# Patient Record
Sex: Male | Born: 1940 | Race: White | Hispanic: No | Marital: Single | Smoking: Never smoker
Health system: Southern US, Community
[De-identification: ages and names within clinical notes are randomized; demographics above are authoritative.]

## PROBLEM LIST (undated history)

## (undated) DIAGNOSIS — F79 Unspecified intellectual disabilities: Secondary | ICD-10-CM

## (undated) DIAGNOSIS — K219 Gastro-esophageal reflux disease without esophagitis: Secondary | ICD-10-CM

## (undated) DIAGNOSIS — I10 Essential (primary) hypertension: Secondary | ICD-10-CM

## (undated) DIAGNOSIS — D649 Anemia, unspecified: Secondary | ICD-10-CM

## (undated) DIAGNOSIS — R269 Unspecified abnormalities of gait and mobility: Secondary | ICD-10-CM

## (undated) DIAGNOSIS — R413 Other amnesia: Secondary | ICD-10-CM

## (undated) DIAGNOSIS — G253 Myoclonus: Secondary | ICD-10-CM

## (undated) DIAGNOSIS — N289 Disorder of kidney and ureter, unspecified: Secondary | ICD-10-CM

## (undated) DIAGNOSIS — N400 Enlarged prostate without lower urinary tract symptoms: Secondary | ICD-10-CM

## (undated) HISTORY — DX: Unspecified abnormalities of gait and mobility: R26.9

## (undated) HISTORY — DX: Other amnesia: R41.3

## (undated) HISTORY — DX: Myoclonus: G25.3

---

## 2001-02-17 ENCOUNTER — Emergency Department (HOSPITAL_COMMUNITY): Admission: EM | Admit: 2001-02-17 | Discharge: 2001-02-18 | Payer: Self-pay | Admitting: Emergency Medicine

## 2001-02-17 ENCOUNTER — Encounter: Payer: Self-pay | Admitting: Emergency Medicine

## 2001-04-09 ENCOUNTER — Ambulatory Visit (HOSPITAL_COMMUNITY): Admission: RE | Admit: 2001-04-09 | Discharge: 2001-04-09 | Payer: Self-pay | Admitting: *Deleted

## 2001-06-25 ENCOUNTER — Ambulatory Visit (HOSPITAL_BASED_OUTPATIENT_CLINIC_OR_DEPARTMENT_OTHER): Admission: RE | Admit: 2001-06-25 | Discharge: 2001-06-25 | Payer: Self-pay | Admitting: Orthopedic Surgery

## 2009-04-20 ENCOUNTER — Emergency Department (HOSPITAL_BASED_OUTPATIENT_CLINIC_OR_DEPARTMENT_OTHER): Admission: EM | Admit: 2009-04-20 | Discharge: 2009-04-20 | Payer: Self-pay | Admitting: Emergency Medicine

## 2009-04-21 ENCOUNTER — Encounter: Payer: Self-pay | Admitting: Emergency Medicine

## 2009-04-21 ENCOUNTER — Ambulatory Visit: Payer: Self-pay | Admitting: Cardiovascular Disease

## 2009-04-21 ENCOUNTER — Ambulatory Visit: Payer: Self-pay | Admitting: Diagnostic Radiology

## 2009-04-21 ENCOUNTER — Inpatient Hospital Stay (HOSPITAL_COMMUNITY): Admission: AD | Admit: 2009-04-21 | Discharge: 2009-04-22 | Payer: Self-pay | Admitting: Internal Medicine

## 2009-04-22 ENCOUNTER — Encounter (INDEPENDENT_AMBULATORY_CARE_PROVIDER_SITE_OTHER): Payer: Self-pay | Admitting: *Deleted

## 2010-10-16 ENCOUNTER — Emergency Department (HOSPITAL_BASED_OUTPATIENT_CLINIC_OR_DEPARTMENT_OTHER)
Admission: EM | Admit: 2010-10-16 | Discharge: 2010-10-16 | Disposition: A | Discharge status: Home / Self Care | Payer: Medicare Other | Source: Home / Self Care | Attending: Emergency Medicine | Admitting: Emergency Medicine

## 2010-10-16 DIAGNOSIS — Z79899 Other long term (current) drug therapy: Secondary | ICD-10-CM | POA: Insufficient documentation

## 2010-10-16 DIAGNOSIS — N390 Urinary tract infection, site not specified: Secondary | ICD-10-CM | POA: Insufficient documentation

## 2010-10-16 DIAGNOSIS — R748 Abnormal levels of other serum enzymes: Secondary | ICD-10-CM | POA: Insufficient documentation

## 2010-10-16 DIAGNOSIS — I10 Essential (primary) hypertension: Secondary | ICD-10-CM | POA: Insufficient documentation

## 2010-10-16 DIAGNOSIS — F79 Unspecified intellectual disabilities: Secondary | ICD-10-CM | POA: Insufficient documentation

## 2010-10-16 DIAGNOSIS — R1011 Right upper quadrant pain: Secondary | ICD-10-CM | POA: Insufficient documentation

## 2010-10-16 LAB — URINALYSIS, ROUTINE W REFLEX MICROSCOPIC
Ketones, ur: NEGATIVE mg/dL
Leukocytes, UA: NEGATIVE
Nitrite: NEGATIVE
Protein, ur: 30 mg/dL — AB
Specific Gravity, Urine: 1.027 (ref 1.005–1.030)
Urine Glucose, Fasting: NEGATIVE mg/dL
Urobilinogen, UA: 0.2 mg/dL (ref 0.0–1.0)
pH: 5.5 (ref 5.0–8.0)

## 2010-10-16 LAB — CBC
HCT: 43.3 % (ref 39.0–52.0)
Hemoglobin: 14.3 g/dL (ref 13.0–17.0)
MCH: 26.8 pg (ref 26.0–34.0)
MCHC: 33 g/dL (ref 30.0–36.0)
MCV: 81.1 fL (ref 78.0–100.0)
Platelets: 216 10*3/uL (ref 150–400)
RBC: 5.34 MIL/uL (ref 4.22–5.81)
RDW: 13.5 % (ref 11.5–15.5)
WBC: 5.4 10*3/uL (ref 4.0–10.5)

## 2010-10-16 LAB — DIFFERENTIAL
Basophils Absolute: 0 10*3/uL (ref 0.0–0.1)
Basophils Relative: 0 % (ref 0–1)
Eosinophils Absolute: 0 10*3/uL (ref 0.0–0.7)
Eosinophils Relative: 0 % (ref 0–5)
Lymphocytes Relative: 14 % (ref 12–46)
Lymphs Abs: 0.7 10*3/uL (ref 0.7–4.0)
Monocytes Absolute: 0.8 10*3/uL (ref 0.1–1.0)
Monocytes Relative: 15 % — ABNORMAL HIGH (ref 3–12)
Neutro Abs: 3.9 10*3/uL (ref 1.7–7.7)
Neutrophils Relative %: 72 % (ref 43–77)

## 2010-10-16 LAB — COMPREHENSIVE METABOLIC PANEL
AST: 90 U/L — ABNORMAL HIGH (ref 0–37)
Alkaline Phosphatase: 80 U/L (ref 39–117)
BUN: 19 mg/dL (ref 6–23)
CO2: 28 mEq/L (ref 19–32)
Chloride: 102 mEq/L (ref 96–112)
Creatinine, Ser: 1.4 mg/dL (ref 0.4–1.5)
GFR calc non Af Amer: 50 mL/min — ABNORMAL LOW (ref >60)
Potassium: 4.5 mEq/L (ref 3.5–5.1)
Total Bilirubin: 0.8 mg/dL (ref 0.3–1.2)

## 2010-10-16 LAB — URINE MICROSCOPIC-ADD ON

## 2010-10-17 ENCOUNTER — Ambulatory Visit (HOSPITAL_BASED_OUTPATIENT_CLINIC_OR_DEPARTMENT_OTHER)
Admission: RE | Admit: 2010-10-17 | Discharge: 2010-10-17 | Disposition: A | Discharge status: Home / Self Care | Payer: Medicare Other | Source: Ambulatory Visit | Attending: Emergency Medicine | Admitting: Emergency Medicine

## 2010-10-17 DIAGNOSIS — I10 Essential (primary) hypertension: Secondary | ICD-10-CM | POA: Insufficient documentation

## 2010-10-17 DIAGNOSIS — R112 Nausea with vomiting, unspecified: Secondary | ICD-10-CM | POA: Insufficient documentation

## 2010-10-17 DIAGNOSIS — Z79899 Other long term (current) drug therapy: Secondary | ICD-10-CM | POA: Insufficient documentation

## 2010-10-17 DIAGNOSIS — R748 Abnormal levels of other serum enzymes: Secondary | ICD-10-CM | POA: Insufficient documentation

## 2010-10-17 DIAGNOSIS — N390 Urinary tract infection, site not specified: Secondary | ICD-10-CM | POA: Insufficient documentation

## 2010-10-17 DIAGNOSIS — R109 Unspecified abdominal pain: Secondary | ICD-10-CM

## 2010-10-17 DIAGNOSIS — R1011 Right upper quadrant pain: Secondary | ICD-10-CM | POA: Insufficient documentation

## 2010-10-17 DIAGNOSIS — F79 Unspecified intellectual disabilities: Secondary | ICD-10-CM | POA: Insufficient documentation

## 2010-12-04 LAB — CBC
HCT: 41.8 % (ref 39.0–52.0)
HCT: 42.4 % (ref 39.0–52.0)
MCHC: 33.5 g/dL (ref 30.0–36.0)
MCHC: 33.7 g/dL (ref 30.0–36.0)
MCV: 84.4 fL (ref 78.0–100.0)
MCV: 85.3 fL (ref 78.0–100.0)
Platelets: 216 10*3/uL (ref 150–400)
RBC: 4.96 MIL/uL (ref 4.22–5.81)
RDW: 12.5 % (ref 11.5–15.5)
WBC: 6.2 10*3/uL (ref 4.0–10.5)
WBC: 7 10*3/uL (ref 4.0–10.5)

## 2010-12-04 LAB — CARDIAC PANEL(CRET KIN+CKTOT+MB+TROPI)
CK, MB: 1.7 ng/mL (ref 0.3–4.0)
CK, MB: 1.8 ng/mL (ref 0.3–4.0)
Relative Index: INVALID (ref 0.0–2.5)
Relative Index: INVALID (ref 0.0–2.5)
Troponin I: 0.01 ng/mL (ref 0.00–0.06)
Troponin I: 0.01 ng/mL (ref 0.00–0.06)

## 2010-12-04 LAB — COMPREHENSIVE METABOLIC PANEL
ALT: 25 U/L (ref 0–53)
AST: 23 U/L (ref 0–37)
Albumin: 3.9 g/dL (ref 3.5–5.2)
CO2: 29 mEq/L (ref 19–32)
Calcium: 8.9 mg/dL (ref 8.4–10.5)
Creatinine, Ser: 1.42 mg/dL (ref 0.4–1.5)
GFR calc Af Amer: >60 mL/min (ref >60)
GFR calc non Af Amer: 50 mL/min — ABNORMAL LOW (ref >60)
Sodium: 138 mEq/L (ref 135–145)

## 2010-12-04 LAB — URINALYSIS, ROUTINE W REFLEX MICROSCOPIC
Glucose, UA: NEGATIVE mg/dL
Hgb urine dipstick: NEGATIVE
Ketones, ur: NEGATIVE mg/dL
pH: 7 (ref 5.0–8.0)

## 2010-12-04 LAB — LIPID PANEL
HDL: 43 mg/dL (ref >39)
Total CHOL/HDL Ratio: 3.4 RATIO
Triglycerides: 50 mg/dL (ref <150)
VLDL: 10 mg/dL (ref 0–40)

## 2010-12-04 LAB — DIFFERENTIAL
Basophils Relative: 0 % (ref 0–1)
Eosinophils Absolute: 0.1 10*3/uL (ref 0.0–0.7)
Eosinophils Absolute: 0.1 10*3/uL (ref 0.0–0.7)
Eosinophils Relative: 1 % (ref 0–5)
Eosinophils Relative: 1 % (ref 0–5)
Lymphs Abs: 1.5 10*3/uL (ref 0.7–4.0)
Lymphs Abs: 2.1 10*3/uL (ref 0.7–4.0)
Monocytes Absolute: 0.8 10*3/uL (ref 0.1–1.0)
Monocytes Relative: 13 % — ABNORMAL HIGH (ref 3–12)
Neutrophils Relative %: 57 % (ref 43–77)

## 2010-12-04 LAB — BASIC METABOLIC PANEL
BUN: 13 mg/dL (ref 6–23)
CO2: 28 mEq/L (ref 19–32)
CO2: 30 mEq/L (ref 19–32)
Chloride: 100 mEq/L (ref 96–112)
Chloride: 103 mEq/L (ref 96–112)
Creatinine, Ser: 1.3 mg/dL (ref 0.4–1.5)
GFR calc Af Amer: >60 mL/min (ref >60)
Glucose, Bld: 112 mg/dL — ABNORMAL HIGH (ref 70–99)
Potassium: 4.2 mEq/L (ref 3.5–5.1)
Potassium: 4.9 mEq/L (ref 3.5–5.1)

## 2010-12-04 LAB — TSH: TSH: 3.338 u[IU]/mL (ref 0.350–4.500)

## 2010-12-04 LAB — POCT CARDIAC MARKERS: Myoglobin, poc: 85.5 ng/mL (ref 12–200)

## 2011-01-11 NOTE — H&P (Signed)
Steven Harper, Steven Harper                  ACCOUNT NO.:  [id number]   MEDICAL RECORD NO.:  [id number]          PATIENT TYPE:  INP   LOCATION:  4707                         FACILITY:  [facility]   PHYSICIAN:  Beckey Rutter, MD  DATE OF BIRTH:  07/23/1941   DATE OF ADMISSION:  04/21/2009  DATE OF DISCHARGE:                              HISTORY & PHYSICAL   ADDENDUM:   SOCIAL HISTORY:  The patient lives in a group home.   REVIEW OF SYSTEMS:  A 14-point review of systems is noncontributory.   PHYSICAL EXAMINATION:  VITAL SIGNS:  His temperature 97.8, heart rate  66, respiratory rate is 18, blood pressure is 134/81, oxygen saturation  is 97 on room air.  HEENT:  Head; atraumatic, normocephalic.  Eyes, PERRL.  Mouth, moist.  No ulcer.  NECK:  Supple.  No JVD.  CARDIAC:  Precordium first and second heart sounds audible.  No added  sounds.  LUNGS:  Clear.  No adventitious sounds.  ABDOMEN:  Soft, nontender.  Bowel sounds present.  EXTREMITIES:  No lower extremities edema.  NEUROLOGIC:  Alert, oriented x3, giving history.  Moving all his  extremities spontaneously.  No focal symptoms.   His EKGs, normal sinus rhythm at ventricular rate of 65 beats per  minute.  His cardiac enzymes showing troponin less than 0.05, myoglobin  is 106, CK-MB is less than 1.0.  Sodium is 142, potassium 4.9, chloride  103, bicarb 30, glucose 103, BUN is 13, creatinine is 1.3.  GFR is 55%.  White blood count is 6.2, hemoglobin is 14.1, hematocrit is 41.8,  platelet count is 12.3.  Urinalysis is negative.  Please note, some of  those tests are done yesterday.   ASSESSMENT AND PLAN:  1. This is a 70 year old pleasant Caucasian male presented with right-      sided chest pain, atypical for cardiogenic origin.  Nevertheless,      the patient will be admitted for 23-hour observation on the      telemetry.  We will cycle cardiac enzymes.  We will continue with      EKG every 8 hours.  If the patient ruled out, he would  need a      stress test as an outpatient.  2. For his hypertension, we will continue on lisinopril.  3. For his acid reflux, the patient will be started on Protonix during      this hospital stay.  4. Deep venous thrombosis prophylaxis.  We will start heparin      subcutaneously every 8 hours.   Time spent more than 55 minutes.      Beckey Rutter, MD  Electronically Signed    EME/MEDQ  D:  04/21/2009  T:  04/22/2009  Job:  [id number]

## 2011-01-11 NOTE — Discharge Summary (Signed)
NAMEMAHLON, GABRIELLE NO.:  [id number]   MEDICAL RECORD NO.:  [id number]          PATIENT TYPE:  INP   LOCATION:  4707                         FACILITY:  [facility]   PHYSICIAN:  Charlestine Massed, MDDATE OF BIRTH:  November 17, 1940   DATE OF ADMISSION:  04/21/2009  DATE OF DISCHARGE:  04/22/2009                               DISCHARGE SUMMARY   PRIMARY CARE PHYSICIAN:  Rockney Ghee, PA-C at [facility] at [place], [address] [place]  [address], phone [phone], fax [phone].   REASON FOR ADMISSION:  Chest pain at multiple points on the right and  left side of the chest.   DISCHARGE DIAGNOSIS:  1. Chest pain most likely noncardiac in origin.  2. Mental retardation.  3. History of chronic kidney disease, currently stable with no      worsening.  4. Hypertension.  5. Dyslipidemia.  6. Benign prostatic hyperplasia.  7. GERD   DISCHARGE MEDICATIONS:  1. ABC plus Senior tablets - Centrum 1 tablet daily.  2. Ferrex 150 units once daily.  3. Fiber Choice two tablets once daily.  4. Lisinopril 2.5 mg p.o. daily.  5. Omeprazole 20 mg p.o. daily.  6. Zocor 20 mg p.o. daily.  7. Aspirin 81 mg p.o. daily.   HOSPITAL COURSE:  1. Chest pain.  The patient was admitted on April 21, 2009 for      complaints of pain on the chest.  The patient's pain was occurring      on multiple points on the chest pain and at all points.  The      patient was able to point in the side of pain with one finger which      goes against the diagnosis of angina.  Pain was sharp in nature,      nonradiating and was lasting for a few seconds to few minutes and      there is no increase with respiration, no worsening with exertion      and no relieving factors.  The patient was admitted to telemetry      floor, observed on telemetry and telemetry has revealed no evidence      of any arrhythmias.  The patient had a CK troponin x3 and EKGs done    which did not show any dynamic changes and troponins are negative.      The patient had an echocardiogram done which revealed normal      ejection fraction and no evidence of any wall motion abnormalities      at this time.  The patient currently with the history as well as      with evaluation, the chest pain looks more noncardiac in origin.  I      spoke with Suszanne Conners, his primary MD at [facility] and he      has agreed to set him up for outpatient stress test.  The patient      will be seeing his primary doctor on Friday, 8:10 a.m. and will      follow up there for  further testing.  He will continue aspirin at      81 mg p.o. daily until then.  2. Hypertension.  Continue lisinopril.  Currently blood pressure is      within normal limits.  3. History of gastroesophageal reflux.  Continue omeprazole 20 mg p.o.      daily.  4. Dyslipidemia.  Continue Zocor.  5. Vitamin supplements.  Continue cyanocobalamin once a month and ABC      plus senior tablet and Ferrex and Fiber Choice chew 2 tablets by      mouth every day.   TESTS DONE DURING THIS ADMISSION:  1. ECHOCARDIOGRAM:  2. Left ventricle: The cavity size was normal. Wall thickness was    normal. Systolic function was normal. The estimated ejection    fraction was in the range of 55% to 60%.  1. Left atrium: The atrium was mildly dilated.  2. Atrial septum: No defect or patent foramen ovale was identified.   DISPOSITION:  Discharged back to the assisted living facility.   FOLLOW UP:  Follow up with Suszanne Conners, PA-C at 10:00 a.m. on April 24, 2009.   INSTRUCTIONS:  1. Fall precautions.  2. Adherence with medications.  3. Adherence with appointments.  4. Stress test as out patient - to be set up by PMD. I discussed with      his PMD - Suszanne Conners personally about this.    A total of 45 minutes spent on the discharge today.      Charlestine Massed, MD  Electronically Signed     UT/MEDQ  D:  04/22/2009   T:  04/22/2009  Job:  [id number]   cc:   Rockney Ghee, PA-C

## 2011-01-11 NOTE — H&P (Signed)
NAMEPARAG, DORTON                  ACCOUNT NO.:  [id number]   MEDICAL RECORD NO.:  [id number]          PATIENT TYPE:  INP   LOCATION:  4707                         FACILITY:  [facility]   PHYSICIAN:  Beckey Rutter, MD  DATE OF BIRTH:  12/15/1940   DATE OF ADMISSION:  04/21/2009  DATE OF DISCHARGE:                              HISTORY & PHYSICAL   PRIMARY CARE PHYSICIAN:  In [facility], this patient is  unassigned to [facility].   CHIEF COMPLAINT:  Chest pain.   HISTORY OF PRESENT ILLNESS:  [name] is a 70 year old Caucasian  gentleman with a past medical history significant for benign prostatic  hyperplasia, anemia, and mental retardation.  The patient lives in a  group home in the [facility].  This morning he started to complain  about right-sided chest pain before the breakfast session.  The patient  then was transferred to the emergency department.  By arrival to the  emergency department, the patient's condition improved, i.e. no more  chest pain.  He denied any association with diaphoresis, shortness of  breath, or cough.  He denied headache, nausea, or vomiting.   It is worth mentioning that [name] had felt dizzy 2 days ago and he  mentioned that he fell, but he did not specifically mention trauma to  his chest.  The patient was tested and evaluated at that time with  negative results.   PAST MEDICAL HISTORY:  1. Benign prostatic hyperplasia.  2. Hypertension.  3. Mental retardation.  4. Renal insufficiency.   MEDICATION ALLERGIES:  Not known to have medication allergy.   MEDICATIONS:  1. Cyanocobalamin 1000 mcg daily.  2. Lisinopril 2.5 mg once a day.  3. Omeprazole 20 mg once daily.  4. Zocor 20 mg daily.   Dictation ended at this point.      Beckey Rutter, MD  Electronically Signed     EME/MEDQ  D:  04/21/2009  T:  04/22/2009  Job:  [id number]

## 2011-01-14 NOTE — Op Note (Signed)
[place]  Patient:    ARYAMAN, HALIBURTON Visit Number: [id number] MRN: [id number]          Service Type: DSU Location: [facility] Attending Physician:  Twana First Dictated by:   Elana Alm Thurston Hole, M.D. Proc. Date: 06/25/01 Admit Date:  06/25/2001                             Operative Report  PREOPERATIVE DIAGNOSIS:  Left knee medial meniscus tear.  POSTOPERATIVE DIAGNOSIS: 1. Left knee medial and lateral meniscal tears. 2. 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  PROCEDURE: 1. Left knee EUA followed by arthroscopic partial medial and lateral    meniscectomies. 2. Left knee chondroplasty.  SURGEON:  Elana Alm. Thurston Hole, M.D.  ANESTHESIA:  General.  OPERATIVE TIME:  30 minutes.  COMPLICATIONS:  None.  INDICATIONS FOR PROCEDURE:  Mr. Swiss is a 70 year old gentleman who has had significant left knee pain for the past six months, increasing in nature, with signs and symptoms and MRI documenting a medial meniscus tear who has failed conservative care and is now to undergo arthroscopy.  DESCRIPTION:  Mr. Ghosh was brought to the operating room June 25, 2001, placed on the operative table in supine position.  After an adequate level of general anesthesia was obtained, his left knee was examined under anesthesia. Range of motion was from 0-130 degrees, 1+ crepitation, knee stable to ligamentous exam with normal patella tracking.  The knee was sterilely injected with 0.25% Marcaine with epinephrine.  The left leg was prepped using sterile Betadine and draped using sterile technique.  Originally, through an inferolateral portal, the arthroscope with a pump attached was placed, and through an inferior medial portal and arthroscopic probe was placed.  On initial inspection of the medial compartment, the articular cartilage, medial femoral condyle, medial tibial plateau showed 25-30% grade 3 chondromalacia, the  grafts grade 1 and 2 changes.  The medial meniscus showed tearing of the posterior and medial horn, of which 30-40% was resected back to a stable rim. Intercondylar notch inspected - anterior and posterior cruciate ligaments were normal.  Lateral compartment inspected - articular cartilage, lateral femoral condyle, lateral tibial plateau showed mild grade 1 and 2 chondromalacia. Lateral meniscus showed a small tear at the posterolateral corner, 25%, which was resected back to a stable rim.  Patellofemoral joint showed grade 4 changes over 50% of the patella, the rest grade 3 changes and grade 2 and 3 changes in the femoral groove and this was debrided.  The patella tracked normally.  Moderate synovitis in the medial and lateral gutters were debrided; otherwise, they were free of pathology.  After this was done it was felt that all pathology had been satisfactorily addressed.  The instruments were removed.  Portals closed with 3-0 nylon suture and injected with 0.25% Marcaine with epinephrine and morphine 4 mg.  Sterile dressings applied and the patient awakened and taken to recovery room in stable condition.  FOLLOW-UP CARE:  Mr. Lubrano will be followed as an outpatient on Vicodin and Vioxx.  See him back in the office in a week for sutures out and follow-up.Dictated by:   Elana Alm Thurston Hole, M.D. Attending Physician:  Twana First DD:  06/25/01 TD:  06/25/01 Job: [id number]

## 2011-01-14 NOTE — Procedures (Signed)
[facility]  Patient:    Steven Harper, Steven Harper                           MRN: [id number] Proc. Date: 04/09/01 Adm. Date:  [id number] Attending:  Sabino Gasser                           Procedure Report  PROCEDURE:  Colonoscopy.  INDICATIONS FOR PROCEDURE:  Colon cancer screening.  ANESTHESIA:  Demerol 40, Versed 4 mg.  DESCRIPTION OF PROCEDURE:  With the patient mildly sedated in the left lateral decubitus position, the Olympus videoscopic variable stiffness colonoscope was inserted in the rectum and passed under direct vision to the cecum identified by the ileocecal valve and appendiceal orifice both of which were photographed. From this point, the colonoscope was slowly withdrawn taking circumferential views of the entire colonic mucosa stopping in the rectum which appeared normal in direct and retroflexed view. The endoscope was straightened and withdrawn. The patients vital signs and pulse oximeter remained stable. The patient tolerated the procedure well without apparent complications.  FINDINGS:  Negative examination.  PLAN:  Repeat examination possibly in 5-10 years. DD:  04/09/01 TD:  04/09/01 Job: [id number]

## 2011-06-30 ENCOUNTER — Emergency Department (INDEPENDENT_AMBULATORY_CARE_PROVIDER_SITE_OTHER): Payer: Medicare Other

## 2011-06-30 ENCOUNTER — Emergency Department (HOSPITAL_BASED_OUTPATIENT_CLINIC_OR_DEPARTMENT_OTHER)
Admission: EM | Admit: 2011-06-30 | Discharge: 2011-06-30 | Disposition: A | Discharge status: Home / Self Care | Payer: Medicare Other | Attending: Emergency Medicine | Admitting: Emergency Medicine

## 2011-06-30 ENCOUNTER — Encounter: Payer: Self-pay | Admitting: *Deleted

## 2011-06-30 DIAGNOSIS — G319 Degenerative disease of nervous system, unspecified: Secondary | ICD-10-CM

## 2011-06-30 DIAGNOSIS — R51 Headache: Secondary | ICD-10-CM | POA: Insufficient documentation

## 2011-06-30 DIAGNOSIS — M47812 Spondylosis without myelopathy or radiculopathy, cervical region: Secondary | ICD-10-CM

## 2011-06-30 DIAGNOSIS — M549 Dorsalgia, unspecified: Secondary | ICD-10-CM | POA: Insufficient documentation

## 2011-06-30 DIAGNOSIS — Z79899 Other long term (current) drug therapy: Secondary | ICD-10-CM | POA: Insufficient documentation

## 2011-06-30 DIAGNOSIS — W19XXXA Unspecified fall, initial encounter: Secondary | ICD-10-CM

## 2011-06-30 DIAGNOSIS — IMO0002 Reserved for concepts with insufficient information to code with codable children: Secondary | ICD-10-CM

## 2011-06-30 DIAGNOSIS — I10 Essential (primary) hypertension: Secondary | ICD-10-CM | POA: Insufficient documentation

## 2011-06-30 DIAGNOSIS — F79 Unspecified intellectual disabilities: Secondary | ICD-10-CM | POA: Insufficient documentation

## 2011-06-30 DIAGNOSIS — M79609 Pain in unspecified limb: Secondary | ICD-10-CM

## 2011-06-30 DIAGNOSIS — M542 Cervicalgia: Secondary | ICD-10-CM | POA: Insufficient documentation

## 2011-06-30 DIAGNOSIS — W1809XA Striking against other object with subsequent fall, initial encounter: Secondary | ICD-10-CM | POA: Insufficient documentation

## 2011-06-30 DIAGNOSIS — I6789 Other cerebrovascular disease: Secondary | ICD-10-CM

## 2011-06-30 HISTORY — DX: Gastro-esophageal reflux disease without esophagitis: K21.9

## 2011-06-30 HISTORY — DX: Benign prostatic hyperplasia without lower urinary tract symptoms: N40.0

## 2011-06-30 HISTORY — DX: Unspecified intellectual disabilities: F79

## 2011-06-30 HISTORY — DX: Essential (primary) hypertension: I10

## 2011-06-30 HISTORY — DX: Anemia, unspecified: D64.9

## 2011-06-30 HISTORY — DX: Disorder of kidney and ureter, unspecified: N28.9

## 2011-06-30 NOTE — ED Notes (Signed)
Pt from [facility]. EMS sts pt was standing in a chair when he fell backwards striking his head on the floor. Pt c/o head pain, thoracic spine pain and leg pain. Pt in c-collar, head blocks and LSB. EMS sts no LOC.

## 2011-06-30 NOTE — ED Provider Notes (Signed)
History     CSN: [id number] Arrival date & time: 06/30/2011  8:39 PM   First MD Initiated Contact with Patient 06/30/11 2116      Chief Complaint  Patient presents with  . Fall    (Consider location/radiation/quality/duration/timing/severity/associated sxs/prior treatment) HPI Comments: Patient was putting a calendar on his wall, fell backwards, hitting the back of his head his lower back. He mass was called. They placed him on a backboard with cervical collar. After he was placed on a backboard, he noted pain in his right thigh. He was [place] [facility] [place].  Pt is unable to perform review of systems to to mental retardation.   Patient is a 70 y.o. male presenting with fall.  Fall    Past Medical History  Diagnosis Date  . Hypertension   . Renal disorder   . BPH (benign prostatic hyperplasia)   . Mental retardation   . Anemia   . GERD (gastroesophageal reflux disease)     History reviewed. No pertinent past surgical history.  History reviewed. No pertinent family history.  History  Substance Use Topics  . Smoking status: Never Smoker   . Smokeless tobacco: Not on file  . Alcohol Use: No      Review of Systems  Unable to perform ROS: Other    Allergies  Review of patient's allergies indicates no known allergies.  Home Medications   Current Outpatient Rx  Name Route Sig Dispense Refill  . ASPIRIN 81 MG PO CHEW Oral Chew 81 mg by mouth daily.      . COLESEVELAM HCL 3.75 G PO PACK Oral Take 1 each by mouth daily. Mixed in 4 oz of water     . FIBER PO CHEW Oral Chew 2 tablets by mouth daily.      Marland Kitchen HYDROCHLOROTHIAZIDE 12.5 MG PO TABS Oral Take 12.5 mg by mouth every other day.      . CERTA-VITE SENIOR-LUTEIN PO TABS Oral Take 1 tablet by mouth daily.      Marland Kitchen OMEPRAZOLE 20 MG PO CPDR Oral Take 20 mg by mouth daily.      Marland Kitchen POTASSIUM CHLORIDE CRYS CR 20 MEQ PO TBCR Oral Take 20 mEq by mouth daily.      . ACETAMINOPHEN 325 MG PO  TABS Oral Take 650 mg by mouth every 8 (eight) hours as needed. For pain or fever     . CYANOCOBALAMIN 1000 MCG/ML IJ SOLN Intramuscular Inject 1,000 mcg into the muscle once.      Marland Kitchen LOPERAMIDE HCL 2 MG PO CAPS Oral Take 1 mg by mouth every 4 (four) hours as needed. For diarrhea until cleared       BP 170/95  Pulse 73  Temp(Src) 98 F (36.7 C) (Oral)  Resp 18  SpO2 98%  Physical Exam  Nursing note and vitals reviewed. Constitutional: He appears well-developed and well-nourished.       He was immobilized on a backboard with cervical collar in place.  HENT:  Head: Normocephalic and atraumatic.  Right Ear: External ear normal.  Left Ear: External ear normal.  Mouth/Throat: Oropharynx is clear and moist.  Eyes: Conjunctivae and EOM are normal. Pupils are equal, round, and reactive to light.  Neck: Normal range of motion. Neck supple.       There is no midline tenderness and no bony deformity the neck. He has full range of motion of his neck without pain. His cervical collar and backboard were discontinued by  me.  Cardiovascular: Normal rate, regular rhythm and normal heart sounds.   Pulmonary/Chest: Effort normal and breath sounds normal.  Abdominal: Soft. Bowel sounds are normal.  Musculoskeletal: Normal range of motion.       He localizes some pain to his lumbar region and to his right thigh. There is no bony deformity or point tenderness ovaries these regions.  Neurological: He is alert. He has normal reflexes.       No sensory or motor deficits.  Skin: Skin is warm and dry.  Psychiatric: He has a normal mood and affect. His behavior is normal.    ED Course  Procedures (including critical care time)  11:40 PM Patient was seen and had physical examination. X-rays were ordered. 11:40 PM X-rays were negative.  Reassured and released.  1. Darlin Coco III, MD 06/30/11 [phone]

## 2011-06-30 NOTE — ED Notes (Signed)
Report called to Yvonne Kendall at [facility]. Pt's family taking him back to SNF by pov.

## 2012-05-03 ENCOUNTER — Encounter (HOSPITAL_BASED_OUTPATIENT_CLINIC_OR_DEPARTMENT_OTHER): Payer: Self-pay | Admitting: *Deleted

## 2012-05-03 ENCOUNTER — Emergency Department (HOSPITAL_BASED_OUTPATIENT_CLINIC_OR_DEPARTMENT_OTHER)
Admission: EM | Admit: 2012-05-03 | Discharge: 2012-05-03 | Disposition: A | Discharge status: Home / Self Care | Payer: Medicare Other | Attending: Emergency Medicine | Admitting: Emergency Medicine

## 2012-05-03 ENCOUNTER — Emergency Department (HOSPITAL_BASED_OUTPATIENT_CLINIC_OR_DEPARTMENT_OTHER): Payer: Medicare Other

## 2012-05-03 DIAGNOSIS — S0990XA Unspecified injury of head, initial encounter: Secondary | ICD-10-CM | POA: Insufficient documentation

## 2012-05-03 DIAGNOSIS — I10 Essential (primary) hypertension: Secondary | ICD-10-CM | POA: Insufficient documentation

## 2012-05-03 DIAGNOSIS — Y92129 Unspecified place in nursing home as the place of occurrence of the external cause: Secondary | ICD-10-CM

## 2012-05-03 DIAGNOSIS — Y9210 Unspecified residential institution as the place of occurrence of the external cause: Secondary | ICD-10-CM | POA: Insufficient documentation

## 2012-05-03 DIAGNOSIS — K219 Gastro-esophageal reflux disease without esophagitis: Secondary | ICD-10-CM | POA: Insufficient documentation

## 2012-05-03 DIAGNOSIS — W010XXA Fall on same level from slipping, tripping and stumbling without subsequent striking against object, initial encounter: Secondary | ICD-10-CM | POA: Insufficient documentation

## 2012-05-03 DIAGNOSIS — Z7982 Long term (current) use of aspirin: Secondary | ICD-10-CM | POA: Insufficient documentation

## 2012-05-03 DIAGNOSIS — F79 Unspecified intellectual disabilities: Secondary | ICD-10-CM | POA: Insufficient documentation

## 2012-05-03 DIAGNOSIS — Z79899 Other long term (current) drug therapy: Secondary | ICD-10-CM | POA: Insufficient documentation

## 2012-05-03 NOTE — ED Notes (Signed)
Here from [facility] staff states he lost his footing and fell onto his bottom complaining of buttock pain

## 2012-05-05 NOTE — ED Provider Notes (Signed)
History     CSN: [id number]  Arrival date & time 05/03/12  3086   First MD Initiated Contact with Patient 05/03/12 1836      Chief Complaint  Patient presents with  . Fall    (Consider location/radiation/quality/duration/timing/severity/associated sxs/prior treatment) HPI Patient is a 71 year old male who presents by EMS from his nursing facility for a fall that occurred while he was in the dining room today. Patient was squatting had a fly and lost his balance. He fell backwards and landed on his buttocks. Patient reports striking his head but had no loss of consciousness. He has no significant signs of head trauma. Patient denies any other pain. He has history of mental retardation and now dementia and is reportedly at his neurologic baseline. He can tell me his name and where he is but is unclear about the year. Family bedside reports patient is at his neurologic baseline and that he has history of frequent falls. Patient denies any pain at this time is hemodynamically stable. History is otherwise limited secondary to patient's ability as a historian. There are no other associated or modifying factors. Past Medical History  Diagnosis Date  . Hypertension   . Renal disorder   . BPH (benign prostatic hyperplasia)   . Mental retardation   . Anemia   . GERD (gastroesophageal reflux disease)     History reviewed. No pertinent past surgical history.  History reviewed. No pertinent family history.  History  Substance Use Topics  . Smoking status: Never Smoker   . Smokeless tobacco: Not on file  . Alcohol Use: No      Review of Systems  Unable to perform ROS: Dementia    Allergies  Review of patient's allergies indicates no known allergies.  Home Medications   Current Outpatient Rx  Name Route Sig Dispense Refill  . ASPIRIN 81 MG PO CHEW Oral Chew 81 mg by mouth daily.      . COLESEVELAM HCL 3.75 G PO PACK Oral Take 1 each by mouth daily. Mixed in 4 oz of water     .  FIBER PO CHEW Oral Chew 2 tablets by mouth daily.      . CERTA-VITE SENIOR-LUTEIN PO TABS Oral Take 1 tablet by mouth daily.      Marland Kitchen OMEPRAZOLE 20 MG PO CPDR Oral Take 20 mg by mouth daily.      . ACETAMINOPHEN 325 MG PO TABS Oral Take 650 mg by mouth every 8 (eight) hours as needed. For pain or fever     . HYDROCHLOROTHIAZIDE 12.5 MG PO TABS Oral Take 12.5 mg by mouth every other day. Medication has been discontinued.    Marland Kitchen POTASSIUM CHLORIDE CRYS ER 20 MEQ PO TBCR Oral Take 20 mEq by mouth daily. Medication has been discontinued.      BP 161/80  Pulse 63  Temp 98.9 F (37.2 C) (Oral)  Resp 18  SpO2 98%  Physical Exam  Nursing note and vitals reviewed. GEN: Well-developed, well-nourished male in no distress HEENT: Possible small hematoma noted in the right posterior scalp otherwise atraumatic, normocephalic.  EYES: PERRLA BL, no scleral icterus. NECK: Trachea midline, no meningismus CV: regular rate and rhythm. No murmurs, rubs, or gallops PULM: No respiratory distress.  No crackles, wheezes, or rales. GI: soft, non-tender. No guarding, rebound, or tenderness. + bowel sounds  GU: deferred Neuro: cranial nerves grossly 2-12 intact, no abnormalities of strength or sensation, A and O x 2 patient has difficulty with year. Uvula and related  without difficulty. MSK: Patient moves all 4 extremities symmetrically, no deformity, edema, or injury noted Skin: No rashes petechiae, purpura, or jaundice Psych: no abnormality of mood   ED Course  Procedures (including critical care time)  Labs Reviewed - No data to display Ct Head Wo Contrast  05/03/2012  *RADIOLOGY REPORT*  Clinical Data: Status post fall with resulting head injury.  CT HEAD WITHOUT CONTRAST  Technique:  Contiguous axial images were obtained from the base of the skull through the vertex without contrast.  Comparison: Head CT 06/30/2011.  Findings: There is no evidence of acute intracranial hemorrhage, mass lesion, brain edema or  extra-axial fluid collection.  Atrophy and mild chronic periventricular white matter disease are stable. There is no evidence of cortical infarct.  The visualized paranasal sinuses are clear.  The mastoids and middle ears are clear.  The calvarium is intact.  IMPRESSION: No acute intracranial or calvarial findings.  Stable atrophy and mild small vessel ischemic changes in the periventricular white matter.   Original Report Authenticated By: Gerrianne Scale, M.D.      1. Fall at nursing home       MDM  Patient was evaluated by myself. Only concerning possible injury given patient presentation was possible intracranial injury. Patient had CT to evaluate for possible unsuspected subdural hematoma. This was negative. Patient's family was at bedside. He was at his baseline neurologic status and was able to be discharged in their care at home. Patient was discharged in good condition.        Cyndra Numbers, MD 05/05/12 [phone]

## 2012-11-24 ENCOUNTER — Telehealth: Payer: Self-pay | Admitting: *Deleted

## 2012-11-24 NOTE — Telephone Encounter (Signed)
Spoke to Edmond the sister in law to cancel appointment. Will call her next week to r/s

## 2012-11-26 NOTE — Telephone Encounter (Signed)
Called sister and got him r/s for 12-11-12.

## 2012-12-06 ENCOUNTER — Ambulatory Visit: Payer: Self-pay | Admitting: Nurse Practitioner

## 2012-12-11 ENCOUNTER — Ambulatory Visit (INDEPENDENT_AMBULATORY_CARE_PROVIDER_SITE_OTHER): Payer: Medicaid Other | Admitting: Nurse Practitioner

## 2012-12-11 ENCOUNTER — Encounter: Payer: Self-pay | Admitting: Nurse Practitioner

## 2012-12-11 VITALS — BP 141/62 | HR 52 | Ht 63.0 in | Wt 139.0 lb

## 2012-12-11 DIAGNOSIS — R413 Other amnesia: Secondary | ICD-10-CM

## 2012-12-11 DIAGNOSIS — F079 Unspecified personality and behavioral disorder due to known physiological condition: Secondary | ICD-10-CM

## 2012-12-11 DIAGNOSIS — F79 Unspecified intellectual disabilities: Secondary | ICD-10-CM

## 2012-12-11 NOTE — Progress Notes (Signed)
I have read the progress note, and I agree with the medical assessment and plan.

## 2012-12-11 NOTE — Patient Instructions (Addendum)
Form filled out for skilled facility

## 2012-12-11 NOTE — Progress Notes (Signed)
HPI: Patient returns for followup after initial evaluation Dr. Anne Hahn 08/07/2012 for memory disturbance. He has a long history of mental retardation and currently resides in an assisted-living. He has 2 year history of progressive memory issues. MRI of the brain in January of 2013 revealed evidence of temporal atrophy bilaterally and short-term memory problems have persisted. The patient is able to dress himself and bathe himself. He had repeat MRI of the brain 08/13/2012 which showed moderate diffuse and severe hippocampal  atrophy. He was started on Aricept 5 mg and has tolerated this dose well. No new neurologic complaints. No recent falls  ROS:   weight gain, memory loss, confusion     Physical Exam General: well developed, well nourished, seated, in no evident distress Head: head normocephalic and atraumatic. Oropharynx benign Neck: supple with no carotid or supraclavicular bruits Cardiovascular: regular rate and rhythm, no murmurs  Neurologic Exam Mental Status: Awake and  alert. MMSE 14/30. AFT 5.  Cranial Nerves:Pupils equal, briskly reactive to light. Extraocular movements full without nystagmus. Visual fields full to confrontation. Hearing intact and symmetric to finger snap. Facial sensation intact. Face, tongue, palate move normally and symmetrically. Neck flexion and extension normal.  Motor: Normal bulk and tone. Normal strength in all tested extremity muscles. Sensory.:  Coordination:Finger-to-nose performed accurately bilaterally, apraxia with heel to shin. . Gait and Station: Arises from chair without difficulty. Stance is wide based. He is unable to perform tandem.   Reflexes: 1+ and symmetric. Toes downgoing.     ASSESSMENT: Progressive memory disturbance, history of mental retardation     PLAN: Increase Aricept to 10 mg daily Followup in 6 months  Nilda Riggs, GNP-BC APRN

## 2013-02-11 ENCOUNTER — Other Ambulatory Visit: Payer: Self-pay

## 2013-02-11 MED ORDER — DONEPEZIL HCL 5 MG PO TABS: 5.0000 mg | ORAL_TABLET | Freq: Every evening | ORAL | Status: DC

## 2013-02-11 NOTE — Telephone Encounter (Signed)
Per Cent[facility] notes, patient takes 5mg  every evening.

## 2013-05-15 IMAGING — CR DG FEMUR 2+V*R*
4 series · 4 of 4 positions shown · non-contrast
Comparison: None.

CLINICAL DATA: Fall

RIGHT FEMUR - 2 VIEW

[t femur with hip  ap right]
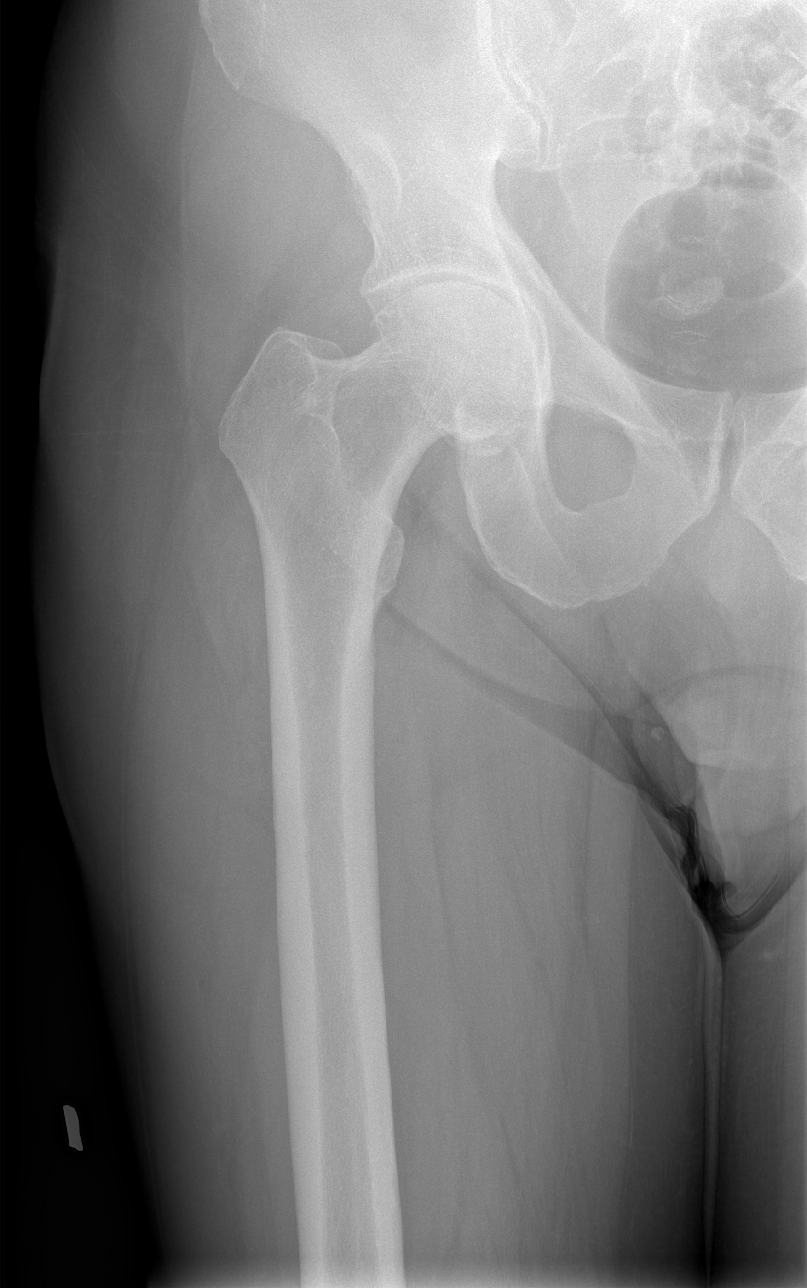

[t femur with knee ap right]
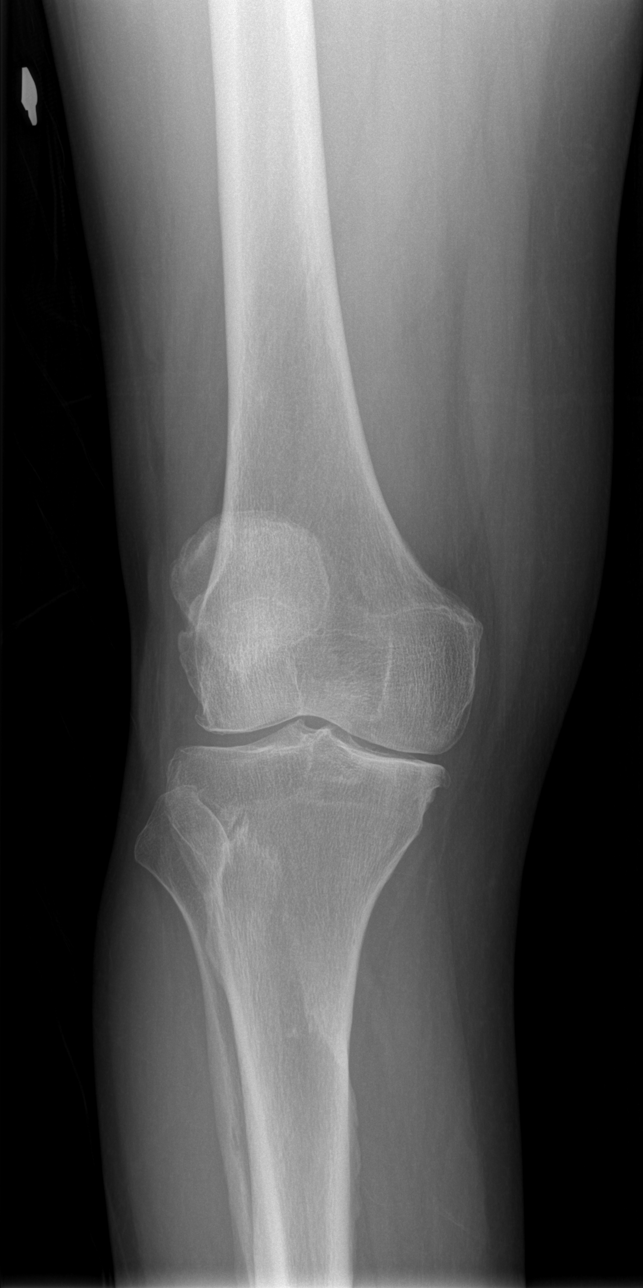

[t femur with hip lat right]
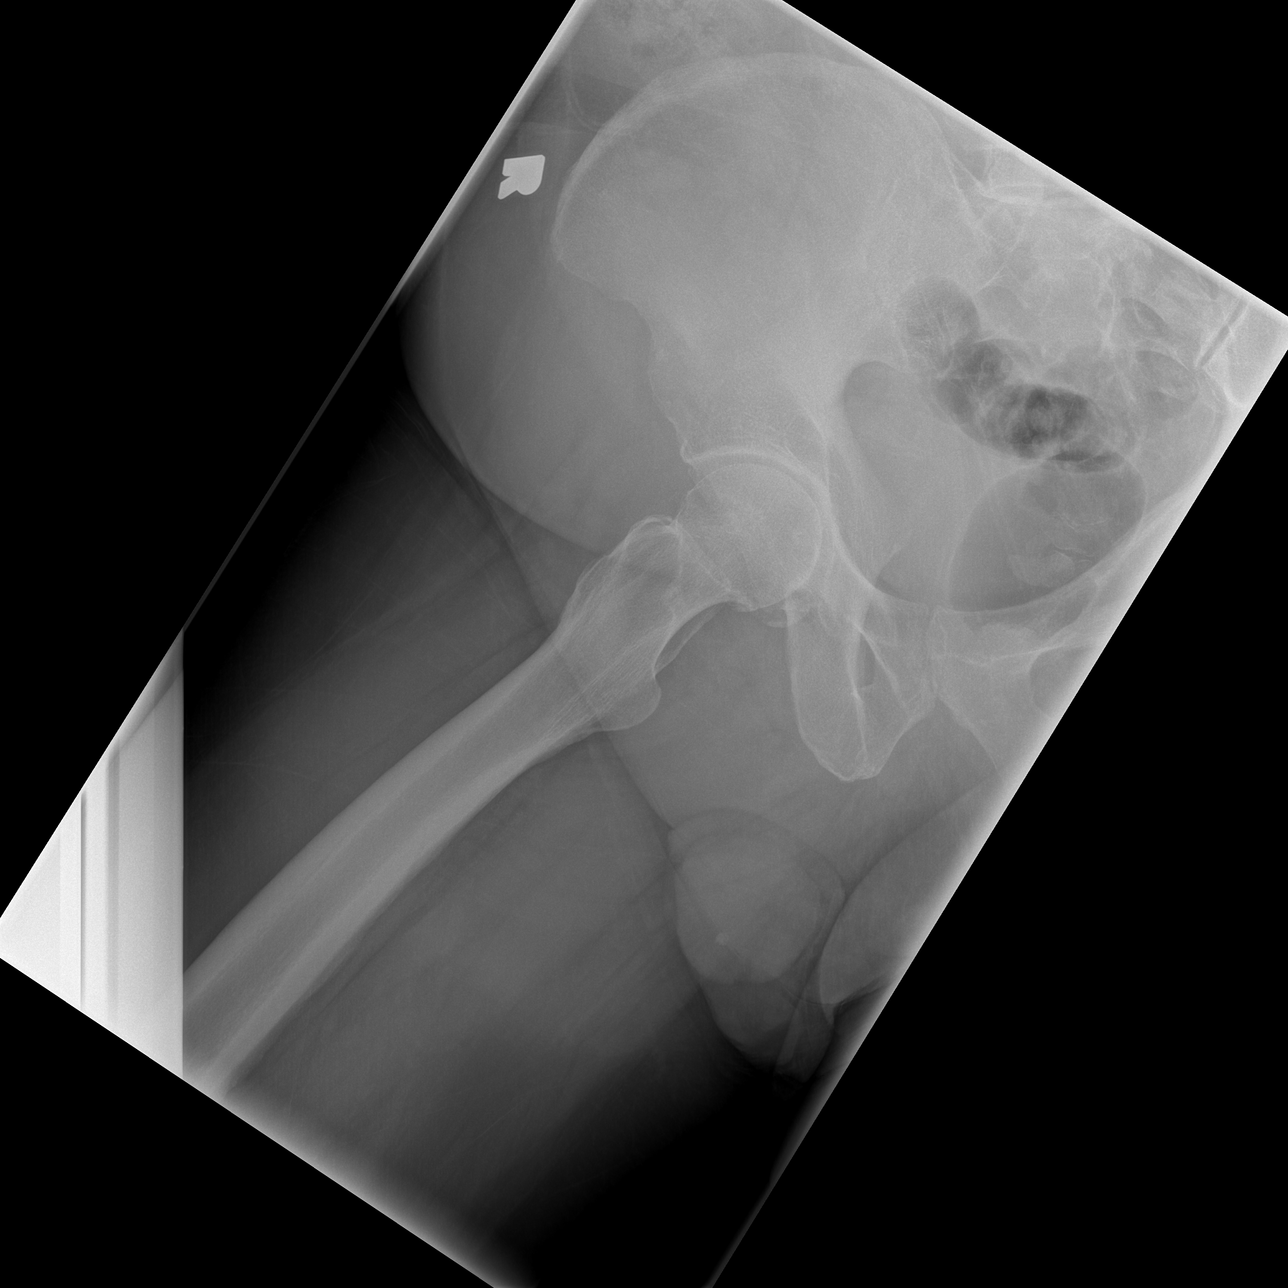

[t femur with knee lat right]
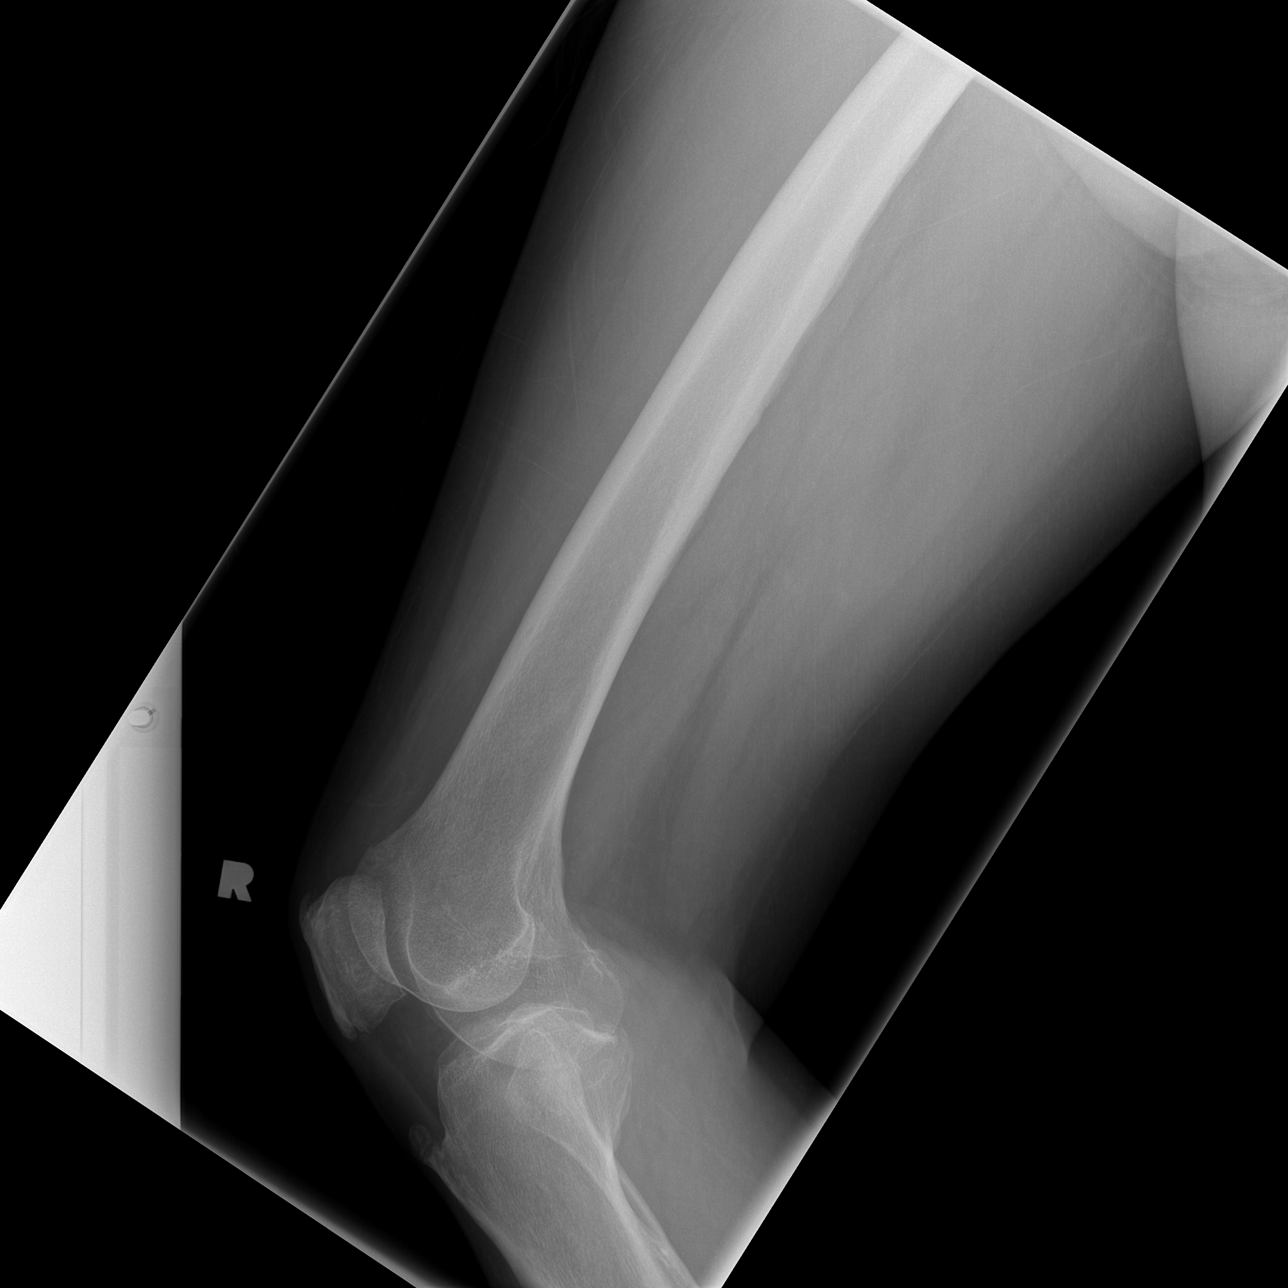

[4 of 4 positions shown; findings below may reference images not displayed]

FINDINGS: Negative for fracture.  Mild joint space narrowing in the
right hip.  Moderate joint space narrowing and spurring in the
knee.
IMPRESSION: Negative for fracture.

## 2013-06-07 ENCOUNTER — Telehealth: Payer: Self-pay | Admitting: Neurology

## 2013-06-07 NOTE — Telephone Encounter (Signed)
The nurse case manager with [name] has concerns and new symptoms about the patient and wanted the provider to be aware of them before his appointment on 06-10-13.  The patient has been has fallen 7 times in the last 2 months, the nurse at his facility has noticed the patient having jerking movements when sitting or standing, his eyes twitching and glassy and his balance has gotten worse.  The patient has been having PT but there has been no improvement in balance issues.  The patient can be walking and he may fall because he is unbalanced.  She also relayed that his brother and another family member has fluid on the brain, and she thinks he may need another MRI.  Her concerns are possible seizures, stroke, tia's and she wanted this relayed so these issues can be assessed at his visit.

## 2013-06-10 ENCOUNTER — Encounter: Payer: Self-pay | Admitting: Nurse Practitioner

## 2013-06-10 ENCOUNTER — Ambulatory Visit (INDEPENDENT_AMBULATORY_CARE_PROVIDER_SITE_OTHER): Payer: Medicare Other | Admitting: Nurse Practitioner

## 2013-06-10 ENCOUNTER — Ambulatory Visit (INDEPENDENT_AMBULATORY_CARE_PROVIDER_SITE_OTHER): Payer: Medicare Other | Admitting: Radiology

## 2013-06-10 VITALS — BP 151/75 | HR 56 | Ht 63.0 in | Wt 139.0 lb

## 2013-06-10 DIAGNOSIS — R259 Unspecified abnormal involuntary movements: Secondary | ICD-10-CM

## 2013-06-10 DIAGNOSIS — G253 Myoclonus: Secondary | ICD-10-CM

## 2013-06-10 DIAGNOSIS — R296 Repeated falls: Secondary | ICD-10-CM

## 2013-06-10 DIAGNOSIS — F079 Unspecified personality and behavioral disorder due to known physiological condition: Secondary | ICD-10-CM

## 2013-06-10 DIAGNOSIS — Z9181 History of falling: Secondary | ICD-10-CM

## 2013-06-10 DIAGNOSIS — R413 Other amnesia: Secondary | ICD-10-CM

## 2013-06-10 DIAGNOSIS — F79 Unspecified intellectual disabilities: Secondary | ICD-10-CM

## 2013-06-10 DIAGNOSIS — R252 Cramp and spasm: Secondary | ICD-10-CM

## 2013-06-10 NOTE — Procedures (Signed)
Steven Harper is a 72 year old gentleman with a history of a progressive memory disturbance. The patient has had increasing problems with myoclonus and a gait disturbance, with several falls in the last 2 months. The patient has been observed to have jerking movements while sitting or standing. The patient is being evaluated for this issue.  This is a routine EEG. No skull defects are noted. Medications include aspirin, WelChol, Aricept, multivitamins, Prilosec, vitamin B12, and potassium.  EEG classification: Dysrhythmia grade 2 generalized, delta grade 1 generalized  Description of the recording: The background rhythms of this recording consists of a medium amplitude 5-6 Hz background activity that is reactive. As the record progresses, photic stimulation is performed, and a minimal bilateral photic driving response is seen. Hyperventilation was not done. Intermittently during the recording, generalized 2 Hz delta frequency slowing is seen. At times, rudimentary sleep spindles are seen suggesting stage II sleep. At no time during the recording does there appear to be evidence of spike or spike wave discharges or evidence of focal slowing. EKG monitor shows no evidence of cardiac rhythm abnormalities with a heart rate of 56.  Impression: This is an abnormal EEG recording secondary to generalized theta and delta frequency slowing. This recording suggests a toxic or metabolic encephalopathy suggestive of diffuse neuronal dysfunction. No clear epileptiform discharges were seen.

## 2013-06-10 NOTE — Patient Instructions (Signed)
Will get EEG today Will get MRI of the brain F/U in 6 months

## 2013-06-10 NOTE — Progress Notes (Signed)
I have read the note, and I agree with the clinical assessment and plan.  Annice Jolly KEITH   

## 2013-06-10 NOTE — Progress Notes (Signed)
[facility]  PATIENT: Steven Harper DOB: 05-15-1941   REASON FOR VISIT: Followup for memory loss   HISTORY OF PRESENT ILLNESS: Steven Harper, 72 year old white male returns for followup. He was initially evaluated Steven Harper 08/07/2012 for memory disturbance. He has a long history of mental retardation and currently resides in an assisted-living. He has 2.5 year history of progressive memory issues. MRI of the brain in January of 2013 revealed evidence of temporal atrophy bilaterally and short-term memory problems have persisted. The patient is able to dress himself and bathe himself. He had repeat MRI of the brain 08/13/2012 which showed moderate diffuse and severe hippocampal  atrophy. He was started on Aricept 5 mg and has tolerated this dose well. His case manager with  [facility] called into the office last week with concerns she wanted to share before his appointment, he has had seven  falls in the last 2 months and the nurse at the facility have noticed jerking movements in sitting or standing, his eyes are twitching and glassy and his balance is very poor. Physical therapy has been tried with no improvement. He returns for evaluation  REVIEW OF SYSTEMS: Full 14 system review of systems performed and notable only for:  Constitutional: Weight loss  Cardiovascular: N/A  Ear/Nose/Throat: N/A  Skin: N/A  Eyes: N/A  Respiratory: N/A  Gastroitestinal: N/A  Hematology/Lymphatic: Anemia  Endocrine: N/A Musculoskeletal:N/A  Allergy/Immunology: N/A  Neurological: Memory loss, confusion, tremor, frequent falls  Psychiatric: N/A   ALLERGIES: No Known Allergies  HOME MEDICATIONS: Outpatient Prescriptions Prior to Visit  Medication Sig Dispense Refill  . acetaminophen (TYLENOL) 325 MG tablet Take 650 mg by mouth every 8 (eight) hours as needed. For pain or fever       . aspirin 81 MG chewable tablet Chew 81 mg by mouth daily.        . Cholecalciferol (VITAMIN D-3) 1000 UNITS  CAPS Take 1,000 Units by mouth daily.      . Colesevelam HCl (WELCHOL) 3.75 G PACK Take 1 each by mouth daily. Mixed in 4 oz of water       . donepezil (ARICEPT) 5 MG tablet Take 1 tablet (5 mg total) by mouth every evening.  90 tablet  1  . Fiber CHEW Chew 2 tablets by mouth daily.        . hydrochlorothiazide (HYDRODIURIL) 12.5 MG tablet Take 12.5 mg by mouth every other day. Medication has been discontinued.      . Multiple Vitamins-Minerals (CERTA-VITE SENIOR-LUTEIN) TABS Take 1 tablet by mouth daily.        Marland Kitchen omeprazole (PRILOSEC) 20 MG capsule Take 20 mg by mouth daily.        . potassium chloride SA (K-DUR,KLOR-CON) 20 MEQ tablet Take 20 mEq by mouth daily. Medication has been discontinued.      . vitamin B-12 (CYANOCOBALAMIN) 1000 MCG tablet Take 1,000 mcg by mouth daily.       No facility-administered medications prior to visit.    PAST MEDICAL HISTORY: Past Medical History  Diagnosis Date  . Hypertension   . Renal disorder   . BPH (benign prostatic hyperplasia)   . Mental retardation   . Anemia   . GERD (gastroesophageal reflux disease)   . Memory loss     PAST SURGICAL HISTORY: History reviewed. No pertinent past surgical history.  FAMILY HISTORY: History reviewed. No pertinent family history.  SOCIAL HISTORY: History   Social History  . Marital Status: Single  Spouse Name: N/A    Number of Children: N/A  . Years of Education: N/A   Occupational History  . Not on file.   Social History Main Topics  . Smoking status: Never Smoker   . Smokeless tobacco: Never Used  . Alcohol Use: No  . Drug Use: No  . Sexual Activity: Not on file   Other Topics Concern  . Not on file   Social History Narrative  . No narrative on file     PHYSICAL EXAM  Filed Vitals:   06/10/13 1117  BP: 151/75  Pulse: 56  Height: 5\' 3"  (1.6 m)  Weight: 139 lb (63.05 kg)   Body mass index is 24.63 kg/(m^2).  Generalized: Well developed, in no acute distress  Head:  normocephalic and atraumatic,. Oropharynx benign  Neck: Supple, no carotid bruits  Cardiac: Regular rate rhythm, no murmur     Neurological examination   Mentation: Alert , MMSE 12/30. AFT 6. oriented to time, place, history taking. Follows all commands speech and language fluent  Cranial nerve II-XII: Pupils were equal round reactive to light extraocular movements were full, visual field were full on confrontational test. Facial sensation and strength were normal. hearing was intact to finger rubbing bilaterally. Uvula tongue midline. head turning and shoulder shrug and were normal and symmetric.Tongue protrusion into cheek strength was normal. Motor: normal bulk and tone, full strength in the BUE, BLE,  No focal weakness Coordination: finger-nose-finger, performed accurately, apraxia with heel-to-shin bilaterally,  Reflexes: 1+ and symmetric  Gait and Station: Rising up from seated position without assistance, wide based stance, unsteady gait no assistive device  DIAGNOSTIC DATA (LABS, IMAGING, TESTING) - None to review   ASSESSMENT AND PLAN  72 y.o. year old male  has a past medical history of Hypertension; Renal disorder; BPH (benign prostatic hyperplasia); Mental retardation; Anemia; and Memory loss. here for followup. 7 falls in the last 2 months and nurse has also noticed jerking movements when sitting or standing, eyes twitching, questionable seizure disorder Discussed with Dr Anne Harper Will get EEG today Will get MRI of the brain F/U in 6 months Steven Harper, [facility], [facility], APRN  [facility] [address] [place], [place] [address] [phone]

## 2013-06-11 ENCOUNTER — Telehealth: Payer: Self-pay | Admitting: Neurology

## 2013-06-11 NOTE — Telephone Encounter (Signed)
Called case manager and made aware of EEG results. Need to get CMP and ammonia level. Left message on voice mail. Mri of the brain has also been ordered.

## 2013-06-12 ENCOUNTER — Ambulatory Visit: Payer: Medicaid Other | Admitting: Nurse Practitioner

## 2013-06-17 ENCOUNTER — Telehealth: Payer: Self-pay | Admitting: *Deleted

## 2013-06-17 NOTE — Telephone Encounter (Signed)
Calling about pts falls and seizure like activity.  Call and speak to [facility] [phone].  I called and spoke to Burnice Logan at [facility] (home #) and pt had another fall this last Friday or Saturday (has periodic spells).  I relayed that MRI authorized 06-11-13 and Cordelia Pen with [facility] has left 2 calls for scheduling.  I relayed this to Bronson Ing, and gave her the phone # to contact [facility] about appt.  Bronson Ing stated that brother would be coming with pt (he is calmer with pt ).  She would contact him today.  I faxed the ofv note to Rhonda (pcp).  EEG results negative for seizure.  Had CT of head this last weekend.  (I have no results).

## 2013-06-26 ENCOUNTER — Ambulatory Visit
Admission: RE | Admit: 2013-06-26 | Discharge: 2013-06-26 | Disposition: A | Discharge status: Home / Self Care | Payer: Medicare Other | Source: Ambulatory Visit | Attending: Nurse Practitioner | Admitting: Nurse Practitioner

## 2013-06-26 DIAGNOSIS — R413 Other amnesia: Secondary | ICD-10-CM

## 2013-06-26 DIAGNOSIS — R296 Repeated falls: Secondary | ICD-10-CM

## 2013-06-26 MED ORDER — GADOBENATE DIMEGLUMINE 529 MG/ML IV SOLN
14.0000 mL | Freq: Once | INTRAVENOUS | Status: AC | PRN
  Administered 2013-06-26: 14 mL via INTRAVENOUS

## 2013-07-01 ENCOUNTER — Other Ambulatory Visit: Payer: Self-pay | Admitting: Nurse Practitioner

## 2013-07-01 MED ORDER — LEVETIRACETAM 250 MG PO TABS: 250.0000 mg | ORAL_TABLET | Freq: Two times a day (BID) | ORAL | Status: DC

## 2013-07-02 ENCOUNTER — Telehealth: Payer: Self-pay | Admitting: Nurse Practitioner

## 2013-07-02 NOTE — Telephone Encounter (Signed)
Called case manager, Ms Sondra Come  Who works for [employer]. Dr. Yetta Barre office will order Keppra. Pt will make a follow up appt in 3 months

## 2013-07-05 ENCOUNTER — Telehealth: Payer: Self-pay | Admitting: *Deleted

## 2013-07-08 ENCOUNTER — Encounter: Payer: Self-pay | Admitting: Neurology

## 2013-07-08 MED ORDER — LEVETIRACETAM 250 MG PO TABS: 250.0000 mg | ORAL_TABLET | Freq: Two times a day (BID) | ORAL | Status: DC

## 2013-07-08 NOTE — Telephone Encounter (Signed)
I spoke with Steven Harper the case manager with [facility] and she stated that the patient's PCP doesn't want to managing the patient's Keppra, nor does she want to stop the Aricept.  She wants neurology to stop the Aricept and order the Keppra 250mg  bid.  The prescription can be faxed to [facility] at [phone].  Further questions can be addressed to [facility] at [phone]

## 2013-07-08 NOTE — Telephone Encounter (Signed)
Will fax to facility to stop Aricept and begin Keppra 250 mg BID

## 2013-07-08 NOTE — Telephone Encounter (Signed)
This encounter was created in error - please disregard.

## 2013-08-29 DIAGNOSIS — K219 Gastro-esophageal reflux disease without esophagitis: Secondary | ICD-10-CM | POA: Diagnosis not present

## 2013-08-29 DIAGNOSIS — E559 Vitamin D deficiency, unspecified: Secondary | ICD-10-CM | POA: Diagnosis not present

## 2013-08-29 DIAGNOSIS — J111 Influenza due to unidentified influenza virus with other respiratory manifestations: Secondary | ICD-10-CM | POA: Diagnosis not present

## 2013-08-29 DIAGNOSIS — J209 Acute bronchitis, unspecified: Secondary | ICD-10-CM | POA: Diagnosis not present

## 2013-08-29 DIAGNOSIS — F0280 Dementia in other diseases classified elsewhere without behavioral disturbance: Secondary | ICD-10-CM | POA: Diagnosis not present

## 2013-08-29 DIAGNOSIS — F0391 Unspecified dementia with behavioral disturbance: Secondary | ICD-10-CM | POA: Diagnosis not present

## 2013-08-29 DIAGNOSIS — F79 Unspecified intellectual disabilities: Secondary | ICD-10-CM | POA: Diagnosis not present

## 2013-08-29 DIAGNOSIS — Z5189 Encounter for other specified aftercare: Secondary | ICD-10-CM | POA: Diagnosis not present

## 2013-08-29 DIAGNOSIS — R4182 Altered mental status, unspecified: Secondary | ICD-10-CM | POA: Diagnosis not present

## 2013-08-29 DIAGNOSIS — I10 Essential (primary) hypertension: Secondary | ICD-10-CM | POA: Diagnosis not present

## 2013-08-29 DIAGNOSIS — M1990 Unspecified osteoarthritis, unspecified site: Secondary | ICD-10-CM | POA: Diagnosis not present

## 2013-08-29 DIAGNOSIS — Z9181 History of falling: Secondary | ICD-10-CM | POA: Diagnosis not present

## 2013-08-29 DIAGNOSIS — E119 Type 2 diabetes mellitus without complications: Secondary | ICD-10-CM | POA: Diagnosis not present

## 2013-08-29 DIAGNOSIS — F03918 Unspecified dementia, unspecified severity, with other behavioral disturbance: Secondary | ICD-10-CM | POA: Diagnosis not present

## 2013-08-29 DIAGNOSIS — R5381 Other malaise: Secondary | ICD-10-CM | POA: Diagnosis not present

## 2013-08-29 DIAGNOSIS — E785 Hyperlipidemia, unspecified: Secondary | ICD-10-CM | POA: Diagnosis not present

## 2013-08-29 DIAGNOSIS — R488 Other symbolic dysfunctions: Secondary | ICD-10-CM | POA: Diagnosis not present

## 2013-09-02 DIAGNOSIS — J111 Influenza due to unidentified influenza virus with other respiratory manifestations: Secondary | ICD-10-CM | POA: Diagnosis not present

## 2013-09-02 DIAGNOSIS — R5381 Other malaise: Secondary | ICD-10-CM | POA: Diagnosis not present

## 2013-09-02 DIAGNOSIS — F0391 Unspecified dementia with behavioral disturbance: Secondary | ICD-10-CM | POA: Diagnosis not present

## 2013-09-02 DIAGNOSIS — J209 Acute bronchitis, unspecified: Secondary | ICD-10-CM | POA: Diagnosis not present

## 2013-09-02 DIAGNOSIS — F03918 Unspecified dementia, unspecified severity, with other behavioral disturbance: Secondary | ICD-10-CM | POA: Diagnosis not present

## 2013-09-06 DIAGNOSIS — E538 Deficiency of other specified B group vitamins: Secondary | ICD-10-CM | POA: Diagnosis not present

## 2013-09-06 DIAGNOSIS — E559 Vitamin D deficiency, unspecified: Secondary | ICD-10-CM | POA: Diagnosis not present

## 2013-09-06 DIAGNOSIS — E039 Hypothyroidism, unspecified: Secondary | ICD-10-CM | POA: Diagnosis not present

## 2013-09-06 DIAGNOSIS — E441 Mild protein-calorie malnutrition: Secondary | ICD-10-CM | POA: Diagnosis not present

## 2013-09-06 DIAGNOSIS — R635 Abnormal weight gain: Secondary | ICD-10-CM | POA: Diagnosis not present

## 2013-09-06 DIAGNOSIS — D518 Other vitamin B12 deficiency anemias: Secondary | ICD-10-CM | POA: Diagnosis not present

## 2013-09-09 DIAGNOSIS — F39 Unspecified mood [affective] disorder: Secondary | ICD-10-CM | POA: Diagnosis not present

## 2013-09-09 DIAGNOSIS — F4320 Adjustment disorder, unspecified: Secondary | ICD-10-CM | POA: Diagnosis not present

## 2013-10-01 DIAGNOSIS — K219 Gastro-esophageal reflux disease without esophagitis: Secondary | ICD-10-CM | POA: Diagnosis not present

## 2013-10-01 DIAGNOSIS — F71 Moderate intellectual disabilities: Secondary | ICD-10-CM | POA: Diagnosis not present

## 2013-10-01 DIAGNOSIS — Z9181 History of falling: Secondary | ICD-10-CM | POA: Diagnosis not present

## 2013-10-01 DIAGNOSIS — R269 Unspecified abnormalities of gait and mobility: Secondary | ICD-10-CM | POA: Diagnosis not present

## 2013-10-18 DIAGNOSIS — F39 Unspecified mood [affective] disorder: Secondary | ICD-10-CM | POA: Diagnosis not present

## 2013-10-18 DIAGNOSIS — F4320 Adjustment disorder, unspecified: Secondary | ICD-10-CM | POA: Diagnosis not present

## 2013-11-01 DIAGNOSIS — K219 Gastro-esophageal reflux disease without esophagitis: Secondary | ICD-10-CM | POA: Diagnosis not present

## 2013-11-01 DIAGNOSIS — R269 Unspecified abnormalities of gait and mobility: Secondary | ICD-10-CM | POA: Diagnosis not present

## 2013-11-01 DIAGNOSIS — F71 Moderate intellectual disabilities: Secondary | ICD-10-CM | POA: Diagnosis not present

## 2013-11-01 DIAGNOSIS — E46 Unspecified protein-calorie malnutrition: Secondary | ICD-10-CM | POA: Diagnosis not present

## 2013-11-07 DIAGNOSIS — F4320 Adjustment disorder, unspecified: Secondary | ICD-10-CM | POA: Diagnosis not present

## 2013-12-03 DIAGNOSIS — E538 Deficiency of other specified B group vitamins: Secondary | ICD-10-CM | POA: Diagnosis not present

## 2014-01-08 DIAGNOSIS — R569 Unspecified convulsions: Secondary | ICD-10-CM | POA: Diagnosis not present

## 2014-01-08 DIAGNOSIS — E785 Hyperlipidemia, unspecified: Secondary | ICD-10-CM | POA: Diagnosis not present

## 2014-01-08 DIAGNOSIS — E119 Type 2 diabetes mellitus without complications: Secondary | ICD-10-CM | POA: Diagnosis not present

## 2014-01-08 DIAGNOSIS — K219 Gastro-esophageal reflux disease without esophagitis: Secondary | ICD-10-CM | POA: Diagnosis not present

## 2014-01-08 DIAGNOSIS — I2510 Atherosclerotic heart disease of native coronary artery without angina pectoris: Secondary | ICD-10-CM | POA: Diagnosis not present

## 2014-01-08 DIAGNOSIS — I10 Essential (primary) hypertension: Secondary | ICD-10-CM | POA: Diagnosis not present

## 2014-03-11 DIAGNOSIS — D500 Iron deficiency anemia secondary to blood loss (chronic): Secondary | ICD-10-CM | POA: Diagnosis not present

## 2014-05-08 DIAGNOSIS — N390 Urinary tract infection, site not specified: Secondary | ICD-10-CM | POA: Diagnosis not present

## 2014-05-08 DIAGNOSIS — R5383 Other fatigue: Secondary | ICD-10-CM | POA: Diagnosis not present

## 2014-05-08 DIAGNOSIS — R5381 Other malaise: Secondary | ICD-10-CM | POA: Diagnosis not present

## 2014-05-08 DIAGNOSIS — N23 Unspecified renal colic: Secondary | ICD-10-CM | POA: Diagnosis not present

## 2014-05-08 DIAGNOSIS — E039 Hypothyroidism, unspecified: Secondary | ICD-10-CM | POA: Diagnosis not present

## 2014-05-14 DIAGNOSIS — F411 Generalized anxiety disorder: Secondary | ICD-10-CM | POA: Diagnosis not present

## 2014-05-14 DIAGNOSIS — F29 Unspecified psychosis not due to a substance or known physiological condition: Secondary | ICD-10-CM | POA: Diagnosis not present

## 2014-05-22 DIAGNOSIS — E559 Vitamin D deficiency, unspecified: Secondary | ICD-10-CM | POA: Diagnosis not present

## 2014-05-22 DIAGNOSIS — R569 Unspecified convulsions: Secondary | ICD-10-CM | POA: Diagnosis not present

## 2014-05-22 DIAGNOSIS — K219 Gastro-esophageal reflux disease without esophagitis: Secondary | ICD-10-CM | POA: Diagnosis not present

## 2014-05-22 DIAGNOSIS — I10 Essential (primary) hypertension: Secondary | ICD-10-CM | POA: Diagnosis not present

## 2014-05-29 DIAGNOSIS — F419 Anxiety disorder, unspecified: Secondary | ICD-10-CM | POA: Diagnosis not present

## 2014-05-29 DIAGNOSIS — F29 Unspecified psychosis not due to a substance or known physiological condition: Secondary | ICD-10-CM | POA: Diagnosis not present

## 2014-07-14 DIAGNOSIS — R569 Unspecified convulsions: Secondary | ICD-10-CM | POA: Diagnosis not present

## 2014-07-14 DIAGNOSIS — K219 Gastro-esophageal reflux disease without esophagitis: Secondary | ICD-10-CM | POA: Diagnosis not present

## 2014-07-14 DIAGNOSIS — E119 Type 2 diabetes mellitus without complications: Secondary | ICD-10-CM | POA: Diagnosis not present

## 2014-07-14 DIAGNOSIS — I2510 Atherosclerotic heart disease of native coronary artery without angina pectoris: Secondary | ICD-10-CM | POA: Diagnosis not present

## 2014-08-27 DIAGNOSIS — F29 Unspecified psychosis not due to a substance or known physiological condition: Secondary | ICD-10-CM | POA: Diagnosis not present

## 2014-08-27 DIAGNOSIS — F419 Anxiety disorder, unspecified: Secondary | ICD-10-CM | POA: Diagnosis not present

## 2014-09-25 DIAGNOSIS — E785 Hyperlipidemia, unspecified: Secondary | ICD-10-CM | POA: Diagnosis not present

## 2014-09-25 DIAGNOSIS — K219 Gastro-esophageal reflux disease without esophagitis: Secondary | ICD-10-CM | POA: Diagnosis not present

## 2014-09-25 DIAGNOSIS — G40901 Epilepsy, unspecified, not intractable, with status epilepticus: Secondary | ICD-10-CM | POA: Diagnosis not present

## 2014-09-25 DIAGNOSIS — E559 Vitamin D deficiency, unspecified: Secondary | ICD-10-CM | POA: Diagnosis not present

## 2014-10-27 DIAGNOSIS — F29 Unspecified psychosis not due to a substance or known physiological condition: Secondary | ICD-10-CM | POA: Diagnosis not present

## 2014-10-27 DIAGNOSIS — F0390 Unspecified dementia without behavioral disturbance: Secondary | ICD-10-CM | POA: Diagnosis not present

## 2014-11-06 DIAGNOSIS — I2510 Atherosclerotic heart disease of native coronary artery without angina pectoris: Secondary | ICD-10-CM | POA: Diagnosis not present

## 2014-11-06 DIAGNOSIS — K219 Gastro-esophageal reflux disease without esophagitis: Secondary | ICD-10-CM | POA: Diagnosis not present

## 2014-11-06 DIAGNOSIS — G40901 Epilepsy, unspecified, not intractable, with status epilepticus: Secondary | ICD-10-CM | POA: Diagnosis not present

## 2014-11-06 DIAGNOSIS — F0390 Unspecified dementia without behavioral disturbance: Secondary | ICD-10-CM | POA: Diagnosis not present

## 2014-11-10 ENCOUNTER — Ambulatory Visit (INDEPENDENT_AMBULATORY_CARE_PROVIDER_SITE_OTHER): Payer: Medicare Other | Admitting: Neurology

## 2014-11-10 ENCOUNTER — Encounter: Payer: Self-pay | Admitting: Neurology

## 2014-11-10 VITALS — BP 100/64 | HR 54 | Ht 64.0 in

## 2014-11-10 DIAGNOSIS — G9341 Metabolic encephalopathy: Secondary | ICD-10-CM | POA: Insufficient documentation

## 2014-11-10 DIAGNOSIS — R413 Other amnesia: Secondary | ICD-10-CM

## 2014-11-10 DIAGNOSIS — G253 Myoclonus: Secondary | ICD-10-CM | POA: Diagnosis not present

## 2014-11-10 DIAGNOSIS — Z5181 Encounter for therapeutic drug level monitoring: Secondary | ICD-10-CM

## 2014-11-10 DIAGNOSIS — R269 Unspecified abnormalities of gait and mobility: Secondary | ICD-10-CM

## 2014-11-10 HISTORY — DX: Myoclonus: G25.3

## 2014-11-10 HISTORY — DX: Unspecified abnormalities of gait and mobility: R26.9

## 2014-11-10 MED ORDER — LEVETIRACETAM 250 MG PO TABS: ORAL_TABLET | ORAL | Status: DC

## 2014-11-10 NOTE — Progress Notes (Signed)
Reason for visit: Myoclonus  Referring physician: Dr. Darlina GuysJerry Powell  Francesca OmanDale Harper is a 74 y.o. male  History of present illness:  Mr. Claudell KyleHuddy is a 74 year old right-handed white male with a history of mental retardation. The patient has developed a progressive dementing illness of the last several years, he was last seen through this office in 2014. The patient has undergone an EEG study in 2014 that showed diffuse slowing consistent with a toxic or metabolic encephalopathy. The patient has developed occasional twitches of the extremities, and he was placed on Keppra empirically. He was initially started on 250 mg twice daily, but at some point, he was increased to a 500 mg twice daily dose. He has had episodes of agitation and aggression, and he has been placed on low-dose Risperdal. He has been noted by the family to be somnolent over the last several months, he has not been ambulatory for a number of years. He requires assistance with all activities of daily living including feeding, dressing, and bathing. He does not consistently recognize his family members. He has had a decline in his verbal output. He has been taken off of the Aricept. He has not had any falls. He has not had any injury to the head. The comes back today for an evaluation of the decline in mental status.  Past Medical History  Diagnosis Date  . Hypertension   . Renal disorder   . BPH (benign prostatic hyperplasia)   . Mental retardation   . Anemia   . GERD (gastroesophageal reflux disease)   . Memory loss   . 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 No pertinent past surgical history.  Family History  Problem Relation Age of Onset  . Dementia Mother   . Cancer Mother   . Cancer - Lung Mother   . Cerebral palsy Brother   . Stroke Brother     Social history:  reports that he has never smoked. He has never used smokeless tobacco. He reports that he does not drink alcohol or use illicit  drugs.  Medications:  Prior to Admission medications   Medication Sig Start Date End Date Taking? Authorizing Provider  aspirin 81 MG chewable tablet Chew 81 mg by mouth daily.     Yes Historical Provider, MD  Cholecalciferol (VITAMIN D-3) 1000 UNITS CAPS Take 1,000 Units by mouth daily.   Yes Historical Provider, MD  colestipol (COLESTID) 5 G granules Take 5 g by mouth daily.   Yes Historical Provider, MD  Fiber CHEW Chew 2 tablets by mouth daily.     Yes Historical Provider, MD  levETIRAcetam (KEPPRA) 250 MG tablet Take 1 tablet (250 mg total) by mouth every 12 (twelve) hours. 07/08/13  Yes Lynder ParentsNancy C Martin, NP  Multiple Vitamins-Minerals (CERTA-VITE SENIOR-LUTEIN) TABS Take 1 tablet by mouth daily.     Yes Historical Provider, MD  omeprazole (PRILOSEC) 20 MG capsule Take 20 mg by mouth daily.     Yes Historical Provider, MD  risperiDONE (RISPERDAL) 0.25 MG tablet Take 0.25 mg by mouth at bedtime.   Yes Historical Provider, MD  vitamin B-12 (CYANOCOBALAMIN) 1000 MCG tablet Take 1,000 mcg by mouth daily.   Yes Historical Provider, MD     No Known Allergies  ROS:  Out of a complete 14 system review of symptoms, the patient complains only of the following symptoms, and all other reviewed systems are negative.  Incontinence of bladder Daytime sleepiness Walking difficulty Memory loss, seizures,  speech difficulty, weakness, tremors Confusion, decreased concentration, hallucinations  Blood pressure 100/64, pulse 54, height  (1.626 m), weight 0 lb (0 kg).  Physical Exam  General: The patient is sleepy, but can be aroused during the evaluation.  Eyes: Pupils are equal, round, and reactive to light. Discs are flat bilaterally.  Neck: The neck is supple, no carotid bruits are noted.  Respiratory: The respiratory examination is clear.  Cardiovascular: The cardiovascular examination reveals a regular rate and rhythm, no obvious murmurs or rubs are noted.  Skin: Extremities are  without significant edema.  Neurologic Exam  Mental status: The patient is sleepy, but can be alerted at the time of examination. The patient is oriented to person, indicates that he is in a doctor's office, is not oriented to date. The patient is able to recognize his family members, he is accompanied by his brother and his wife. The patient was unable to remain alert enough to complete the Mini-Mental Status Examination well, he got only one out of 30 responses correct.  Cranial nerves: Facial symmetry is present. There is good sensation of the face to pinprick and soft touch bilaterally. The strength of the facial muscles and the muscles to head turning and shoulder shrug are normal bilaterally. Speech is dysarthric, difficult to understand, may be aphasic as well.. Extraocular movements are full. Visual fields are full to threat, the patient blinks bilaterally.. The tongue is midline, and the patient has symmetric elevation of the soft palate. No obvious hearing deficits are noted.  Motor: The motor testing reveals 5 over 5 strength of all 4 extremities. The patient has gegenhalten rigidity on all 4 extremities.  Sensory: Sensory testing is intact to soft touch on all 4 extremities, including the face.  Coordination: Cerebellar testing reveals that he can perform finger-nose-finger, has difficulty performing heel-to-shin on both sides.  Gait and station: The patient is nonambulatory, wheelchair bound. Attempts with standing reveal that the patient extends the legs, leans backwards, unable to become upright. The patient cannot functionally walk.  Reflexes: Deep tendon reflexes are symmetric and normal bilaterally. Toes are downgoing bilaterally.   MRI brain 06/28/13:  IMPRESSION: Abnormal MRI scan of the brain showing moderate generalized cerebral atrophy which is age disproportionate. Overall no significant change compared with the MR scan dated 09/26/2011  * MRI scan images were reviewed  online. I agree with the written report.     Assessment/Plan:  1. History of mental retardation  2. Progressive dementing illness  3. Questionable seizure disorder  4. Altered mental status, encephalopathy  5. Polymyoclonus  The patient appears to be sleepy, lethargic, with associated polymyoclonus. The clinical presentation is most consistent with a toxic or metabolic encephalopathy. The patient will be set up for blood work today, the Keppra dose will be decreased taking 250 mg in the morning, 500 mg in the evening. He will follow-up in 3-4 months. An EEG study will be done.  Marlan Palau MD 11/10/2014 7:49 PM  [facility] [address] [place], [place] [address]  Phone [phone] Fax [phone]

## 2014-11-10 NOTE — Patient Instructions (Signed)
Myoclonus Myoclonus is a term that refers to brief, involuntary twitching or jerking of a muscle or a group of muscles. It describes a symptom, and generally, is not a diagnosis of a disease. The myoclonic twitches or jerks are usually caused by sudden muscle contractions. They also can result from brief lapses of contraction. Myoclonic twitches or jerks may occur:  Alone or in sequence.  In a pattern or without pattern.  Infrequently or many times each minute. Often times, myoclonus is one of several symptoms in a wide variety of nervous system disorders such as:  Multiple sclerosis.  Parkinson's disease.  Alzheimer's disease.  Creutzfeldt-Jakob disease. Familiar examples of normal myoclonus include:  Hiccups and jerks.  "Sleep starts" that some people have while drifting off to sleep. Severe cases can severely limit a person's ability to:  Eat.  Talk.  Walk. Myoclonic jerks commonly occur in individuals with epilepsy. The most common types of myoclonus include:  Action.  Cortical reflex.  Essential.  Palatal.  Progressive myoclonus epilepsy.  Reticular reflex.  Sleep.  Stimulus-sensitive. TREATMENT  Treatment for myoclonus consists of medicines that may help reduce symptoms. These drugs (many of which are also used to treat epilepsy) include:   Barbiturates.  Clonazepam.  Phenytoin.  Primidone.  Sodium valproate. The complex origins of myoclonus may require the use of multiple drugs. Document Released: 08/05/2002 Document Revised: 11/07/2011 Document Reviewed: 07/18/2013 ExitCare Patient Information 2015 [employer]. This information is not intended to replace advice given to you by your health care provider. Make sure you discuss any questions you have with your health care provider.  

## 2014-11-11 DIAGNOSIS — F29 Unspecified psychosis not due to a substance or known physiological condition: Secondary | ICD-10-CM | POA: Diagnosis not present

## 2014-11-11 DIAGNOSIS — F0390 Unspecified dementia without behavioral disturbance: Secondary | ICD-10-CM | POA: Diagnosis not present

## 2014-11-11 LAB — CBC WITH DIFFERENTIAL/PLATELET
BASOS ABS: 0 10*3/uL (ref 0.0–0.2)
Basos: 0 %
EOS ABS: 0 10*3/uL (ref 0.0–0.4)
EOS: 1 %
HCT: 40.3 % (ref 37.5–51.0)
Hemoglobin: 13 g/dL (ref 12.6–17.7)
IMMATURE GRANS (ABS): 0 10*3/uL (ref 0.0–0.1)
IMMATURE GRANULOCYTES: 0 %
LYMPHS ABS: 1.4 10*3/uL (ref 0.7–3.1)
Lymphs: 30 %
MCH: 28.4 pg (ref 26.6–33.0)
MCHC: 32.3 g/dL (ref 31.5–35.7)
MCV: 88 fL (ref 79–97)
MONOS ABS: 0.5 10*3/uL (ref 0.1–0.9)
Monocytes: 11 %
NEUTROS PCT: 58 %
Neutrophils Absolute: 2.7 10*3/uL (ref 1.4–7.0)
PLATELETS: 225 10*3/uL (ref 150–379)
RBC: 4.57 x10E6/uL (ref 4.14–5.80)
RDW: 13.3 % (ref 12.3–15.4)
WBC: 4.7 10*3/uL (ref 3.4–10.8)

## 2014-11-11 LAB — COMPREHENSIVE METABOLIC PANEL
A/G RATIO: 1.8 (ref 1.1–2.5)
ALBUMIN: 4.2 g/dL (ref 3.5–4.8)
ALT: 14 IU/L (ref 0–44)
AST: 18 IU/L (ref 0–40)
Alkaline Phosphatase: 58 IU/L (ref 39–117)
BUN/Creatinine Ratio: 13 (ref 10–22)
BUN: 15 mg/dL (ref 8–27)
Bilirubin Total: 0.3 mg/dL (ref 0.0–1.2)
CALCIUM: 9.5 mg/dL (ref 8.6–10.2)
CO2: 26 mmol/L (ref 18–29)
CREATININE: 1.14 mg/dL (ref 0.76–1.27)
Chloride: 101 mmol/L (ref 97–108)
GFR calc Af Amer: 73 mL/min/{1.73_m2} (ref >59)
GFR, EST NON AFRICAN AMERICAN: 63 mL/min/{1.73_m2} (ref >59)
GLOBULIN, TOTAL: 2.4 g/dL (ref 1.5–4.5)
Glucose: 104 mg/dL — ABNORMAL HIGH (ref 65–99)
Potassium: 5.2 mmol/L (ref 3.5–5.2)
SODIUM: 143 mmol/L (ref 134–144)
TOTAL PROTEIN: 6.6 g/dL (ref 6.0–8.5)

## 2014-11-11 LAB — AMMONIA: Ammonia: 44 ug/dL (ref 27–102)

## 2014-11-12 DIAGNOSIS — Z8659 Personal history of other mental and behavioral disorders: Secondary | ICD-10-CM | POA: Diagnosis not present

## 2014-11-12 DIAGNOSIS — F0150 Vascular dementia without behavioral disturbance: Secondary | ICD-10-CM | POA: Diagnosis not present

## 2014-11-13 DIAGNOSIS — E785 Hyperlipidemia, unspecified: Secondary | ICD-10-CM | POA: Diagnosis not present

## 2014-11-13 DIAGNOSIS — R739 Hyperglycemia, unspecified: Secondary | ICD-10-CM | POA: Diagnosis not present

## 2014-11-18 ENCOUNTER — Telehealth: Payer: Self-pay | Admitting: Neurology

## 2014-11-18 ENCOUNTER — Ambulatory Visit (INDEPENDENT_AMBULATORY_CARE_PROVIDER_SITE_OTHER): Payer: Medicare Other | Admitting: Neurology

## 2014-11-18 DIAGNOSIS — G253 Myoclonus: Secondary | ICD-10-CM | POA: Diagnosis not present

## 2014-11-18 DIAGNOSIS — R413 Other amnesia: Secondary | ICD-10-CM | POA: Diagnosis not present

## 2014-11-18 NOTE — Telephone Encounter (Signed)
I called the patient, talk with the family. The EEG shows generalized slowing, triphasic waves consistent with a toxic/metabolic encephalopathy. The etiology of this is not clear. I would like to try to gradually reduce the Keppra, and also get him off of the Risperdal. The patient currently is residing at [facility], I need the telephone number or fax number to send in the orders. The family will try to get this for me, call our office.

## 2014-11-18 NOTE — Procedures (Signed)
     History: Francesca OmanDale Winfield is a 74 year old patient with a history of a progressive dementia. The patient has become somnolent, with polymyoclonus that has developed more recently. The patient is being evaluated for this alteration in mental status.  This is a routine EEG. No skull defects are noted. Medications include aspirin, vitamin D, colestipol, Keppra, multivitamins, Prilosec, Risperdal, and vitamin B12.  EEG classification: Dysrhythmia grade 3 generalized, triphasics  Description of the recording: The background rhythms of this recording consists of a somewhat poorly modulated medium amplitude theta frequency activity of 6 Hz that is reactive to eye opening and closure. As the record progresses, the background theta slowing is persistent throughout the recording. Intermittently, triphasic waves are seen in a generalized fashion, more prominent in the frontal regions. Myoclonic jerks are noted throughout the recording, being translated as a motor artifact in the EEG recording. Photic stimulation is performed, this results in a minimal photic driving response. Hyperventilation is also performed, this results in a minimal buildup of the background rhythm activities without significant increase in slowing seen. At no time during the recording does there appear to be evidence of spike or spike-wave discharges. No focal slowing is seen. EKG monitor shows no evidence of cardiac rhythm abnormalities with a heart rate of 56.  Impression: This is an abnormal EEG recording secondary to diffuse background theta slowing associated with triphasic waves, with frontal predominance. This recording is consistent with a toxic or metabolic encephalopathy. Motor artifact from myoclonic jerks are seen throughout the recording, no clear epileptiform discharges are seen.

## 2014-11-26 DIAGNOSIS — F0390 Unspecified dementia without behavioral disturbance: Secondary | ICD-10-CM | POA: Diagnosis not present

## 2014-11-26 DIAGNOSIS — F29 Unspecified psychosis not due to a substance or known physiological condition: Secondary | ICD-10-CM | POA: Diagnosis not present

## 2014-12-02 NOTE — Telephone Encounter (Signed)
Geraldo DockerGerald Harper is calling back to give requested information to Dr. Anne HahnWillis.  The telephone number to [facility] is [phone] the fax# [phone] Attn: Calton DachBrenda Mureness.

## 2014-12-02 NOTE — Telephone Encounter (Signed)
The patient currently is on Keppra taking 250 mg the morning, 500 mg in the evening, we will reduce the dose to a 250 mg twice daily dosing schedule. I will have the Risperdal discontinued at this time. We need to try to simplify medication regimen.  I will request through his extended care facility to #1. Reduce Keppra to 250 mg twice daily               #2. Discontinue Risperdal.  I will fax this note to the number listed.

## 2014-12-08 DIAGNOSIS — F29 Unspecified psychosis not due to a substance or known physiological condition: Secondary | ICD-10-CM | POA: Diagnosis not present

## 2014-12-08 DIAGNOSIS — F0390 Unspecified dementia without behavioral disturbance: Secondary | ICD-10-CM | POA: Diagnosis not present

## 2015-01-10 DIAGNOSIS — R509 Fever, unspecified: Secondary | ICD-10-CM | POA: Diagnosis not present

## 2015-01-29 DIAGNOSIS — R1319 Other dysphagia: Secondary | ICD-10-CM | POA: Diagnosis not present

## 2015-01-29 DIAGNOSIS — E089 Diabetes mellitus due to underlying condition without complications: Secondary | ICD-10-CM | POA: Diagnosis not present

## 2015-01-29 DIAGNOSIS — K226 Gastro-esophageal laceration-hemorrhage syndrome: Secondary | ICD-10-CM | POA: Diagnosis not present

## 2015-01-29 DIAGNOSIS — I10 Essential (primary) hypertension: Secondary | ICD-10-CM | POA: Diagnosis not present

## 2015-03-12 ENCOUNTER — Ambulatory Visit (INDEPENDENT_AMBULATORY_CARE_PROVIDER_SITE_OTHER): Payer: Medicare Other | Admitting: Neurology

## 2015-03-12 ENCOUNTER — Encounter: Payer: Self-pay | Admitting: Neurology

## 2015-03-12 VITALS — BP 110/70 | HR 68 | Ht 63.0 in

## 2015-03-12 DIAGNOSIS — G253 Myoclonus: Secondary | ICD-10-CM

## 2015-03-12 DIAGNOSIS — G9341 Metabolic encephalopathy: Secondary | ICD-10-CM

## 2015-03-12 DIAGNOSIS — R252 Cramp and spasm: Secondary | ICD-10-CM

## 2015-03-12 DIAGNOSIS — R258 Other abnormal involuntary movements: Secondary | ICD-10-CM

## 2015-03-12 DIAGNOSIS — R269 Unspecified abnormalities of gait and mobility: Secondary | ICD-10-CM

## 2015-03-12 DIAGNOSIS — R413 Other amnesia: Secondary | ICD-10-CM | POA: Diagnosis not present

## 2015-03-12 NOTE — Patient Instructions (Signed)

## 2015-03-12 NOTE — Progress Notes (Signed)
Reason for visit: Mental retardation  Steven Harper is an 74 y.o. male  History of present illness:  Steven Harper is a 74 year old right-handed white male with a history of mental retardation who had a rather sudden decline in physical and cognitive functioning several years ago, and he has had a more gradual change in cognitive function since that time. 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 The patient requires some assistance with feeding, he is not able to use a utensil to feed himself. The patient can pick up food to eat. He is not holding food in his mouth. The patient has not had any recent falls. He is on Keppra, the dose was reduced when he was last seen, Risperdal was discontinued. 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 The patient somewhat more alert off of the Risperdal and with the reduction in Keppra dose.  Past Medical History  Diagnosis Date  . Hypertension   . Renal disorder   . BPH (benign prostatic hyperplasia)   . Mental retardation   . Anemia   . GERD (gastroesophageal reflux disease)   . Memory loss   . 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 No pertinent past surgical history.  Family History  Problem Relation Age of Onset  . Dementia Mother   . Cancer Mother   . Cancer - Lung Mother   . Cerebral palsy Brother   . Stroke Brother     Social history:  reports that he has never smoked. He has never used smokeless tobacco. He reports that he does not drink alcohol or use illicit drugs.   No Known Allergies  Medications:  Prior to Admission medications   Medication Sig Start Date End Date Taking? Authorizing Provider  aspirin 81 MG chewable tablet Chew 81 mg by mouth daily.     Yes Historical Provider, MD  Cholecalciferol (VITAMIN D-3) 1000 UNITS CAPS Take 1,000 Units by mouth daily.   Yes  Historical Provider, MD  colestipol (COLESTID) 5 G granules Take 5 g by mouth daily.   Yes Historical Provider, MD  Fiber CHEW Chew 2 tablets by mouth daily.     Yes Historical Provider, MD  levETIRAcetam (KEPPRA) 250 MG tablet One tablet in the morning and 2 in the evening 11/10/14  Yes York Spaniel, MD  Multiple Vitamins-Minerals (CERTA-VITE SENIOR-LUTEIN) TABS Take 1 tablet by mouth daily.     Yes Historical Provider, MD  omeprazole (PRILOSEC) 20 MG capsule Take 20 mg by mouth daily.     Yes Historical Provider, MD  vitamin B-12 (CYANOCOBALAMIN) 1000 MCG tablet Take 1,000 mcg by mouth daily.   Yes Historical Provider, MD  levETIRAcetam (KEPPRA) 100 MG/ML solution Take 250 mg by mouth. 250 mg AM and 500 mg PM 02/16/15   Historical Provider, MD    ROS:  Out of a complete 14 system review of symptoms, the patient complains only of the following symptoms, and all other reviewed systems are negative.  Incontinence of bowels and bladder Memory loss, seizures, speech difficulty, weakness, tremors Agitation, confusion, decreased concentration  Blood pressure 110/70, pulse 68, height  (1.6 m).  Physical Exam  General: The patient is alert and cooperative at the time of the examination.  Skin: No significant peripheral edema is noted.   Neurologic Exam  Mental status: The patient is alert,  minimally verbal. The patient cannot cooperate for Mini-Mental status examination. He is able to state his name, otherwise speech is relatively nonsensical.   Cranial nerves: Facial symmetry is present. Speech is nonsensical at times, dysarthria. Extraocular movements are full. Visual fields are full to threat.  Motor: The patient has good strength in all 4 extremities.  Sensory examination: Soft touch sensation is symmetric on the face, arms, and legs.  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  Gait and station: The patient could not  be ambulated, he is wheelchair-bound.  Reflexes: Deep tendon reflexes are symmetric.   Assessment/Plan:  1. Severe dementia  2. Polymyoclonus  3. 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 The patient continues to have a slow progression of his cognitive function. The patient will follow-up in about 8 months. No other specific therapy was offered.  Marlan Palau. Keith Willis MD 03/12/2015 8:08 PM  [facility] [address] [place], [place] [id number]  Phone [phone] Fax [phone]

## 2015-03-17 DIAGNOSIS — F29 Unspecified psychosis not due to a substance or known physiological condition: Secondary | ICD-10-CM | POA: Diagnosis not present

## 2015-03-17 DIAGNOSIS — F0390 Unspecified dementia without behavioral disturbance: Secondary | ICD-10-CM | POA: Diagnosis not present

## 2015-03-25 DIAGNOSIS — E785 Hyperlipidemia, unspecified: Secondary | ICD-10-CM | POA: Diagnosis not present

## 2015-03-25 DIAGNOSIS — K219 Gastro-esophageal reflux disease without esophagitis: Secondary | ICD-10-CM | POA: Diagnosis not present

## 2015-03-25 DIAGNOSIS — I10 Essential (primary) hypertension: Secondary | ICD-10-CM | POA: Diagnosis not present

## 2015-03-25 DIAGNOSIS — E559 Vitamin D deficiency, unspecified: Secondary | ICD-10-CM | POA: Diagnosis not present

## 2015-04-01 DIAGNOSIS — G4700 Insomnia, unspecified: Secondary | ICD-10-CM | POA: Diagnosis not present

## 2015-04-01 DIAGNOSIS — F0390 Unspecified dementia without behavioral disturbance: Secondary | ICD-10-CM | POA: Diagnosis not present

## 2015-04-01 DIAGNOSIS — F29 Unspecified psychosis not due to a substance or known physiological condition: Secondary | ICD-10-CM | POA: Diagnosis not present

## 2015-04-16 DIAGNOSIS — F0390 Unspecified dementia without behavioral disturbance: Secondary | ICD-10-CM | POA: Diagnosis not present

## 2015-04-16 DIAGNOSIS — F29 Unspecified psychosis not due to a substance or known physiological condition: Secondary | ICD-10-CM | POA: Diagnosis not present

## 2015-04-16 DIAGNOSIS — G4700 Insomnia, unspecified: Secondary | ICD-10-CM | POA: Diagnosis not present

## 2015-05-14 DIAGNOSIS — F0281 Dementia in other diseases classified elsewhere with behavioral disturbance: Secondary | ICD-10-CM | POA: Diagnosis not present

## 2015-05-14 DIAGNOSIS — F79 Unspecified intellectual disabilities: Secondary | ICD-10-CM | POA: Diagnosis not present

## 2015-05-14 DIAGNOSIS — G40901 Epilepsy, unspecified, not intractable, with status epilepticus: Secondary | ICD-10-CM | POA: Diagnosis not present

## 2015-05-14 DIAGNOSIS — K219 Gastro-esophageal reflux disease without esophagitis: Secondary | ICD-10-CM | POA: Diagnosis not present

## 2015-08-17 DIAGNOSIS — F29 Unspecified psychosis not due to a substance or known physiological condition: Secondary | ICD-10-CM | POA: Diagnosis not present

## 2015-08-17 DIAGNOSIS — G4700 Insomnia, unspecified: Secondary | ICD-10-CM | POA: Diagnosis not present

## 2015-08-17 DIAGNOSIS — F0390 Unspecified dementia without behavioral disturbance: Secondary | ICD-10-CM | POA: Diagnosis not present

## 2015-09-15 ENCOUNTER — Ambulatory Visit: Payer: Medicare Other | Admitting: Nurse Practitioner

## 2015-09-17 DIAGNOSIS — F79 Unspecified intellectual disabilities: Secondary | ICD-10-CM | POA: Diagnosis not present

## 2015-09-17 DIAGNOSIS — K219 Gastro-esophageal reflux disease without esophagitis: Secondary | ICD-10-CM | POA: Diagnosis not present

## 2015-09-17 DIAGNOSIS — G40901 Epilepsy, unspecified, not intractable, with status epilepticus: Secondary | ICD-10-CM | POA: Diagnosis not present

## 2015-09-17 DIAGNOSIS — E1165 Type 2 diabetes mellitus with hyperglycemia: Secondary | ICD-10-CM | POA: Diagnosis not present

## 2015-09-22 DIAGNOSIS — R293 Abnormal posture: Secondary | ICD-10-CM | POA: Diagnosis not present

## 2015-09-22 DIAGNOSIS — M6281 Muscle weakness (generalized): Secondary | ICD-10-CM | POA: Diagnosis not present

## 2015-09-23 DIAGNOSIS — R293 Abnormal posture: Secondary | ICD-10-CM | POA: Diagnosis not present

## 2015-09-23 DIAGNOSIS — M6281 Muscle weakness (generalized): Secondary | ICD-10-CM | POA: Diagnosis not present

## 2015-09-28 DIAGNOSIS — M6281 Muscle weakness (generalized): Secondary | ICD-10-CM | POA: Diagnosis not present

## 2015-09-28 DIAGNOSIS — R293 Abnormal posture: Secondary | ICD-10-CM | POA: Diagnosis not present

## 2015-09-30 DIAGNOSIS — R293 Abnormal posture: Secondary | ICD-10-CM | POA: Diagnosis not present

## 2015-09-30 DIAGNOSIS — M6281 Muscle weakness (generalized): Secondary | ICD-10-CM | POA: Diagnosis not present

## 2015-09-30 DIAGNOSIS — M24571 Contracture, right ankle: Secondary | ICD-10-CM | POA: Diagnosis not present

## 2015-09-30 DIAGNOSIS — M24572 Contracture, left ankle: Secondary | ICD-10-CM | POA: Diagnosis not present

## 2015-10-02 DIAGNOSIS — M6281 Muscle weakness (generalized): Secondary | ICD-10-CM | POA: Diagnosis not present

## 2015-10-02 DIAGNOSIS — M24572 Contracture, left ankle: Secondary | ICD-10-CM | POA: Diagnosis not present

## 2015-10-02 DIAGNOSIS — R293 Abnormal posture: Secondary | ICD-10-CM | POA: Diagnosis not present

## 2015-10-02 DIAGNOSIS — M24571 Contracture, right ankle: Secondary | ICD-10-CM | POA: Diagnosis not present

## 2015-10-07 DIAGNOSIS — R293 Abnormal posture: Secondary | ICD-10-CM | POA: Diagnosis not present

## 2015-10-07 DIAGNOSIS — M24571 Contracture, right ankle: Secondary | ICD-10-CM | POA: Diagnosis not present

## 2015-10-07 DIAGNOSIS — M24572 Contracture, left ankle: Secondary | ICD-10-CM | POA: Diagnosis not present

## 2015-10-07 DIAGNOSIS — M6281 Muscle weakness (generalized): Secondary | ICD-10-CM | POA: Diagnosis not present

## 2015-10-08 DIAGNOSIS — R293 Abnormal posture: Secondary | ICD-10-CM | POA: Diagnosis not present

## 2015-10-08 DIAGNOSIS — M6281 Muscle weakness (generalized): Secondary | ICD-10-CM | POA: Diagnosis not present

## 2015-10-08 DIAGNOSIS — M24572 Contracture, left ankle: Secondary | ICD-10-CM | POA: Diagnosis not present

## 2015-10-08 DIAGNOSIS — M24571 Contracture, right ankle: Secondary | ICD-10-CM | POA: Diagnosis not present

## 2015-10-09 DIAGNOSIS — M24572 Contracture, left ankle: Secondary | ICD-10-CM | POA: Diagnosis not present

## 2015-10-09 DIAGNOSIS — M24571 Contracture, right ankle: Secondary | ICD-10-CM | POA: Diagnosis not present

## 2015-10-09 DIAGNOSIS — R293 Abnormal posture: Secondary | ICD-10-CM | POA: Diagnosis not present

## 2015-10-09 DIAGNOSIS — M6281 Muscle weakness (generalized): Secondary | ICD-10-CM | POA: Diagnosis not present

## 2015-10-12 DIAGNOSIS — R293 Abnormal posture: Secondary | ICD-10-CM | POA: Diagnosis not present

## 2015-10-12 DIAGNOSIS — M6281 Muscle weakness (generalized): Secondary | ICD-10-CM | POA: Diagnosis not present

## 2015-10-12 DIAGNOSIS — M24571 Contracture, right ankle: Secondary | ICD-10-CM | POA: Diagnosis not present

## 2015-10-12 DIAGNOSIS — M24572 Contracture, left ankle: Secondary | ICD-10-CM | POA: Diagnosis not present

## 2015-10-13 DIAGNOSIS — R293 Abnormal posture: Secondary | ICD-10-CM | POA: Diagnosis not present

## 2015-10-13 DIAGNOSIS — M24572 Contracture, left ankle: Secondary | ICD-10-CM | POA: Diagnosis not present

## 2015-10-13 DIAGNOSIS — M24571 Contracture, right ankle: Secondary | ICD-10-CM | POA: Diagnosis not present

## 2015-10-13 DIAGNOSIS — M6281 Muscle weakness (generalized): Secondary | ICD-10-CM | POA: Diagnosis not present

## 2015-10-14 DIAGNOSIS — R293 Abnormal posture: Secondary | ICD-10-CM | POA: Diagnosis not present

## 2015-10-14 DIAGNOSIS — M6281 Muscle weakness (generalized): Secondary | ICD-10-CM | POA: Diagnosis not present

## 2015-10-14 DIAGNOSIS — M24571 Contracture, right ankle: Secondary | ICD-10-CM | POA: Diagnosis not present

## 2015-10-14 DIAGNOSIS — M24572 Contracture, left ankle: Secondary | ICD-10-CM | POA: Diagnosis not present

## 2015-10-15 DIAGNOSIS — M6281 Muscle weakness (generalized): Secondary | ICD-10-CM | POA: Diagnosis not present

## 2015-10-15 DIAGNOSIS — M24571 Contracture, right ankle: Secondary | ICD-10-CM | POA: Diagnosis not present

## 2015-10-15 DIAGNOSIS — M24572 Contracture, left ankle: Secondary | ICD-10-CM | POA: Diagnosis not present

## 2015-10-15 DIAGNOSIS — R293 Abnormal posture: Secondary | ICD-10-CM | POA: Diagnosis not present

## 2015-10-19 DIAGNOSIS — M24572 Contracture, left ankle: Secondary | ICD-10-CM | POA: Diagnosis not present

## 2015-10-19 DIAGNOSIS — M6281 Muscle weakness (generalized): Secondary | ICD-10-CM | POA: Diagnosis not present

## 2015-10-19 DIAGNOSIS — R293 Abnormal posture: Secondary | ICD-10-CM | POA: Diagnosis not present

## 2015-10-19 DIAGNOSIS — M24571 Contracture, right ankle: Secondary | ICD-10-CM | POA: Diagnosis not present

## 2015-10-21 DIAGNOSIS — M24572 Contracture, left ankle: Secondary | ICD-10-CM | POA: Diagnosis not present

## 2015-10-21 DIAGNOSIS — M6281 Muscle weakness (generalized): Secondary | ICD-10-CM | POA: Diagnosis not present

## 2015-10-21 DIAGNOSIS — R293 Abnormal posture: Secondary | ICD-10-CM | POA: Diagnosis not present

## 2015-10-21 DIAGNOSIS — M24571 Contracture, right ankle: Secondary | ICD-10-CM | POA: Diagnosis not present

## 2015-10-22 DIAGNOSIS — M24571 Contracture, right ankle: Secondary | ICD-10-CM | POA: Diagnosis not present

## 2015-10-22 DIAGNOSIS — M24572 Contracture, left ankle: Secondary | ICD-10-CM | POA: Diagnosis not present

## 2015-10-22 DIAGNOSIS — R293 Abnormal posture: Secondary | ICD-10-CM | POA: Diagnosis not present

## 2015-10-22 DIAGNOSIS — M6281 Muscle weakness (generalized): Secondary | ICD-10-CM | POA: Diagnosis not present

## 2015-10-23 DIAGNOSIS — M24572 Contracture, left ankle: Secondary | ICD-10-CM | POA: Diagnosis not present

## 2015-10-23 DIAGNOSIS — M24571 Contracture, right ankle: Secondary | ICD-10-CM | POA: Diagnosis not present

## 2015-10-23 DIAGNOSIS — R293 Abnormal posture: Secondary | ICD-10-CM | POA: Diagnosis not present

## 2015-10-23 DIAGNOSIS — M6281 Muscle weakness (generalized): Secondary | ICD-10-CM | POA: Diagnosis not present

## 2015-10-26 DIAGNOSIS — M6281 Muscle weakness (generalized): Secondary | ICD-10-CM | POA: Diagnosis not present

## 2015-10-26 DIAGNOSIS — R293 Abnormal posture: Secondary | ICD-10-CM | POA: Diagnosis not present

## 2015-10-26 DIAGNOSIS — M24571 Contracture, right ankle: Secondary | ICD-10-CM | POA: Diagnosis not present

## 2015-10-26 DIAGNOSIS — M24572 Contracture, left ankle: Secondary | ICD-10-CM | POA: Diagnosis not present

## 2015-10-27 DIAGNOSIS — M24571 Contracture, right ankle: Secondary | ICD-10-CM | POA: Diagnosis not present

## 2015-10-27 DIAGNOSIS — R293 Abnormal posture: Secondary | ICD-10-CM | POA: Diagnosis not present

## 2015-10-27 DIAGNOSIS — M24572 Contracture, left ankle: Secondary | ICD-10-CM | POA: Diagnosis not present

## 2015-10-27 DIAGNOSIS — M6281 Muscle weakness (generalized): Secondary | ICD-10-CM | POA: Diagnosis not present

## 2015-10-28 DIAGNOSIS — M24571 Contracture, right ankle: Secondary | ICD-10-CM | POA: Diagnosis not present

## 2015-10-28 DIAGNOSIS — M24572 Contracture, left ankle: Secondary | ICD-10-CM | POA: Diagnosis not present

## 2015-10-28 DIAGNOSIS — R293 Abnormal posture: Secondary | ICD-10-CM | POA: Diagnosis not present

## 2015-10-28 DIAGNOSIS — M6281 Muscle weakness (generalized): Secondary | ICD-10-CM | POA: Diagnosis not present

## 2015-10-29 DIAGNOSIS — M24572 Contracture, left ankle: Secondary | ICD-10-CM | POA: Diagnosis not present

## 2015-10-29 DIAGNOSIS — M24571 Contracture, right ankle: Secondary | ICD-10-CM | POA: Diagnosis not present

## 2015-10-29 DIAGNOSIS — R293 Abnormal posture: Secondary | ICD-10-CM | POA: Diagnosis not present

## 2015-10-29 DIAGNOSIS — M6281 Muscle weakness (generalized): Secondary | ICD-10-CM | POA: Diagnosis not present

## 2015-10-30 DIAGNOSIS — M6281 Muscle weakness (generalized): Secondary | ICD-10-CM | POA: Diagnosis not present

## 2015-10-30 DIAGNOSIS — M24571 Contracture, right ankle: Secondary | ICD-10-CM | POA: Diagnosis not present

## 2015-10-30 DIAGNOSIS — M24572 Contracture, left ankle: Secondary | ICD-10-CM | POA: Diagnosis not present

## 2015-10-30 DIAGNOSIS — R293 Abnormal posture: Secondary | ICD-10-CM | POA: Diagnosis not present

## 2015-11-02 DIAGNOSIS — R293 Abnormal posture: Secondary | ICD-10-CM | POA: Diagnosis not present

## 2015-11-02 DIAGNOSIS — M6281 Muscle weakness (generalized): Secondary | ICD-10-CM | POA: Diagnosis not present

## 2015-11-02 DIAGNOSIS — M24572 Contracture, left ankle: Secondary | ICD-10-CM | POA: Diagnosis not present

## 2015-11-02 DIAGNOSIS — M24571 Contracture, right ankle: Secondary | ICD-10-CM | POA: Diagnosis not present

## 2015-11-03 DIAGNOSIS — R293 Abnormal posture: Secondary | ICD-10-CM | POA: Diagnosis not present

## 2015-11-03 DIAGNOSIS — M24572 Contracture, left ankle: Secondary | ICD-10-CM | POA: Diagnosis not present

## 2015-11-03 DIAGNOSIS — M24571 Contracture, right ankle: Secondary | ICD-10-CM | POA: Diagnosis not present

## 2015-11-03 DIAGNOSIS — M6281 Muscle weakness (generalized): Secondary | ICD-10-CM | POA: Diagnosis not present

## 2015-11-04 DIAGNOSIS — R293 Abnormal posture: Secondary | ICD-10-CM | POA: Diagnosis not present

## 2015-11-04 DIAGNOSIS — M24571 Contracture, right ankle: Secondary | ICD-10-CM | POA: Diagnosis not present

## 2015-11-04 DIAGNOSIS — M6281 Muscle weakness (generalized): Secondary | ICD-10-CM | POA: Diagnosis not present

## 2015-11-04 DIAGNOSIS — M24572 Contracture, left ankle: Secondary | ICD-10-CM | POA: Diagnosis not present

## 2015-11-05 DIAGNOSIS — M6281 Muscle weakness (generalized): Secondary | ICD-10-CM | POA: Diagnosis not present

## 2015-11-05 DIAGNOSIS — R293 Abnormal posture: Secondary | ICD-10-CM | POA: Diagnosis not present

## 2015-11-05 DIAGNOSIS — M24572 Contracture, left ankle: Secondary | ICD-10-CM | POA: Diagnosis not present

## 2015-11-05 DIAGNOSIS — M24571 Contracture, right ankle: Secondary | ICD-10-CM | POA: Diagnosis not present

## 2015-11-09 DIAGNOSIS — M24572 Contracture, left ankle: Secondary | ICD-10-CM | POA: Diagnosis not present

## 2015-11-09 DIAGNOSIS — M24571 Contracture, right ankle: Secondary | ICD-10-CM | POA: Diagnosis not present

## 2015-11-09 DIAGNOSIS — M6281 Muscle weakness (generalized): Secondary | ICD-10-CM | POA: Diagnosis not present

## 2015-11-09 DIAGNOSIS — R293 Abnormal posture: Secondary | ICD-10-CM | POA: Diagnosis not present

## 2015-11-10 DIAGNOSIS — M6281 Muscle weakness (generalized): Secondary | ICD-10-CM | POA: Diagnosis not present

## 2015-11-10 DIAGNOSIS — M24572 Contracture, left ankle: Secondary | ICD-10-CM | POA: Diagnosis not present

## 2015-11-10 DIAGNOSIS — R293 Abnormal posture: Secondary | ICD-10-CM | POA: Diagnosis not present

## 2015-11-10 DIAGNOSIS — M24571 Contracture, right ankle: Secondary | ICD-10-CM | POA: Diagnosis not present

## 2015-11-11 DIAGNOSIS — M24571 Contracture, right ankle: Secondary | ICD-10-CM | POA: Diagnosis not present

## 2015-11-11 DIAGNOSIS — G40901 Epilepsy, unspecified, not intractable, with status epilepticus: Secondary | ICD-10-CM | POA: Diagnosis not present

## 2015-11-11 DIAGNOSIS — M6281 Muscle weakness (generalized): Secondary | ICD-10-CM | POA: Diagnosis not present

## 2015-11-11 DIAGNOSIS — M24572 Contracture, left ankle: Secondary | ICD-10-CM | POA: Diagnosis not present

## 2015-11-11 DIAGNOSIS — E559 Vitamin D deficiency, unspecified: Secondary | ICD-10-CM | POA: Diagnosis not present

## 2015-11-11 DIAGNOSIS — F79 Unspecified intellectual disabilities: Secondary | ICD-10-CM | POA: Diagnosis not present

## 2015-11-11 DIAGNOSIS — F0281 Dementia in other diseases classified elsewhere with behavioral disturbance: Secondary | ICD-10-CM | POA: Diagnosis not present

## 2015-11-11 DIAGNOSIS — R293 Abnormal posture: Secondary | ICD-10-CM | POA: Diagnosis not present

## 2015-11-16 DIAGNOSIS — M6281 Muscle weakness (generalized): Secondary | ICD-10-CM | POA: Diagnosis not present

## 2015-11-16 DIAGNOSIS — M24571 Contracture, right ankle: Secondary | ICD-10-CM | POA: Diagnosis not present

## 2015-11-16 DIAGNOSIS — R293 Abnormal posture: Secondary | ICD-10-CM | POA: Diagnosis not present

## 2015-11-16 DIAGNOSIS — M24572 Contracture, left ankle: Secondary | ICD-10-CM | POA: Diagnosis not present

## 2015-11-17 DIAGNOSIS — M24571 Contracture, right ankle: Secondary | ICD-10-CM | POA: Diagnosis not present

## 2015-11-17 DIAGNOSIS — R293 Abnormal posture: Secondary | ICD-10-CM | POA: Diagnosis not present

## 2015-11-17 DIAGNOSIS — M6281 Muscle weakness (generalized): Secondary | ICD-10-CM | POA: Diagnosis not present

## 2015-11-17 DIAGNOSIS — M24572 Contracture, left ankle: Secondary | ICD-10-CM | POA: Diagnosis not present

## 2015-11-19 DIAGNOSIS — M6281 Muscle weakness (generalized): Secondary | ICD-10-CM | POA: Diagnosis not present

## 2015-11-19 DIAGNOSIS — R293 Abnormal posture: Secondary | ICD-10-CM | POA: Diagnosis not present

## 2015-11-19 DIAGNOSIS — M24572 Contracture, left ankle: Secondary | ICD-10-CM | POA: Diagnosis not present

## 2015-11-19 DIAGNOSIS — M24571 Contracture, right ankle: Secondary | ICD-10-CM | POA: Diagnosis not present

## 2015-11-24 DIAGNOSIS — M6281 Muscle weakness (generalized): Secondary | ICD-10-CM | POA: Diagnosis not present

## 2015-11-24 DIAGNOSIS — M24572 Contracture, left ankle: Secondary | ICD-10-CM | POA: Diagnosis not present

## 2015-11-24 DIAGNOSIS — M24571 Contracture, right ankle: Secondary | ICD-10-CM | POA: Diagnosis not present

## 2015-11-24 DIAGNOSIS — R293 Abnormal posture: Secondary | ICD-10-CM | POA: Diagnosis not present

## 2015-12-02 DIAGNOSIS — R1312 Dysphagia, oropharyngeal phase: Secondary | ICD-10-CM | POA: Diagnosis not present

## 2015-12-02 DIAGNOSIS — M24572 Contracture, left ankle: Secondary | ICD-10-CM | POA: Diagnosis not present

## 2015-12-02 DIAGNOSIS — R293 Abnormal posture: Secondary | ICD-10-CM | POA: Diagnosis not present

## 2015-12-02 DIAGNOSIS — M6281 Muscle weakness (generalized): Secondary | ICD-10-CM | POA: Diagnosis not present

## 2015-12-02 DIAGNOSIS — R278 Other lack of coordination: Secondary | ICD-10-CM | POA: Diagnosis not present

## 2015-12-04 ENCOUNTER — Encounter: Payer: Self-pay | Admitting: Neurology

## 2015-12-04 ENCOUNTER — Telehealth: Payer: Self-pay | Admitting: Neurology

## 2015-12-04 DIAGNOSIS — R278 Other lack of coordination: Secondary | ICD-10-CM | POA: Diagnosis not present

## 2015-12-04 DIAGNOSIS — M24572 Contracture, left ankle: Secondary | ICD-10-CM | POA: Diagnosis not present

## 2015-12-04 DIAGNOSIS — R293 Abnormal posture: Secondary | ICD-10-CM | POA: Diagnosis not present

## 2015-12-04 DIAGNOSIS — M6281 Muscle weakness (generalized): Secondary | ICD-10-CM | POA: Diagnosis not present

## 2015-12-04 DIAGNOSIS — R1312 Dysphagia, oropharyngeal phase: Secondary | ICD-10-CM | POA: Diagnosis not present

## 2015-12-04 NOTE — Telephone Encounter (Signed)
I have written a letter indicating that the patient is not competent to manage his own financial affairs.

## 2015-12-04 NOTE — Telephone Encounter (Signed)
Sister in law Consuella Loselaine called, requests letter for [employer], so that patient's Social Security Check can be deposited into husband's account and he can be designated as Theme park managerayee (brother-Gerald M. Scardina), letter needs to include patient's name, DOB and medical condition and that patient is unable to handle financial affairs. Brother Earvin HansenGerald is currently POA and guardian. Sister in law is requesting letter today, states she can bring typed letter to our office, so that we could transfer to our letter head and have Dr. Anne HahnWillis sign, making it faster and easier.

## 2015-12-17 DIAGNOSIS — M6281 Muscle weakness (generalized): Secondary | ICD-10-CM | POA: Diagnosis not present

## 2015-12-17 DIAGNOSIS — M24572 Contracture, left ankle: Secondary | ICD-10-CM | POA: Diagnosis not present

## 2015-12-17 DIAGNOSIS — R293 Abnormal posture: Secondary | ICD-10-CM | POA: Diagnosis not present

## 2015-12-17 DIAGNOSIS — R1312 Dysphagia, oropharyngeal phase: Secondary | ICD-10-CM | POA: Diagnosis not present

## 2015-12-17 DIAGNOSIS — R278 Other lack of coordination: Secondary | ICD-10-CM | POA: Diagnosis not present

## 2015-12-19 DIAGNOSIS — R293 Abnormal posture: Secondary | ICD-10-CM | POA: Diagnosis not present

## 2015-12-19 DIAGNOSIS — M6281 Muscle weakness (generalized): Secondary | ICD-10-CM | POA: Diagnosis not present

## 2015-12-19 DIAGNOSIS — R278 Other lack of coordination: Secondary | ICD-10-CM | POA: Diagnosis not present

## 2015-12-19 DIAGNOSIS — M24572 Contracture, left ankle: Secondary | ICD-10-CM | POA: Diagnosis not present

## 2015-12-19 DIAGNOSIS — R1312 Dysphagia, oropharyngeal phase: Secondary | ICD-10-CM | POA: Diagnosis not present

## 2015-12-21 DIAGNOSIS — M6281 Muscle weakness (generalized): Secondary | ICD-10-CM | POA: Diagnosis not present

## 2015-12-21 DIAGNOSIS — M24572 Contracture, left ankle: Secondary | ICD-10-CM | POA: Diagnosis not present

## 2015-12-21 DIAGNOSIS — R1312 Dysphagia, oropharyngeal phase: Secondary | ICD-10-CM | POA: Diagnosis not present

## 2015-12-21 DIAGNOSIS — R293 Abnormal posture: Secondary | ICD-10-CM | POA: Diagnosis not present

## 2015-12-21 DIAGNOSIS — R278 Other lack of coordination: Secondary | ICD-10-CM | POA: Diagnosis not present

## 2015-12-22 DIAGNOSIS — G4700 Insomnia, unspecified: Secondary | ICD-10-CM | POA: Diagnosis not present

## 2015-12-22 DIAGNOSIS — F0390 Unspecified dementia without behavioral disturbance: Secondary | ICD-10-CM | POA: Diagnosis not present

## 2015-12-23 DIAGNOSIS — R278 Other lack of coordination: Secondary | ICD-10-CM | POA: Diagnosis not present

## 2015-12-23 DIAGNOSIS — M24572 Contracture, left ankle: Secondary | ICD-10-CM | POA: Diagnosis not present

## 2015-12-23 DIAGNOSIS — R293 Abnormal posture: Secondary | ICD-10-CM | POA: Diagnosis not present

## 2015-12-23 DIAGNOSIS — M6281 Muscle weakness (generalized): Secondary | ICD-10-CM | POA: Diagnosis not present

## 2015-12-23 DIAGNOSIS — R1312 Dysphagia, oropharyngeal phase: Secondary | ICD-10-CM | POA: Diagnosis not present

## 2015-12-24 DIAGNOSIS — M24572 Contracture, left ankle: Secondary | ICD-10-CM | POA: Diagnosis not present

## 2015-12-24 DIAGNOSIS — R278 Other lack of coordination: Secondary | ICD-10-CM | POA: Diagnosis not present

## 2015-12-24 DIAGNOSIS — M6281 Muscle weakness (generalized): Secondary | ICD-10-CM | POA: Diagnosis not present

## 2015-12-24 DIAGNOSIS — R1312 Dysphagia, oropharyngeal phase: Secondary | ICD-10-CM | POA: Diagnosis not present

## 2015-12-24 DIAGNOSIS — R293 Abnormal posture: Secondary | ICD-10-CM | POA: Diagnosis not present

## 2015-12-25 DIAGNOSIS — M6281 Muscle weakness (generalized): Secondary | ICD-10-CM | POA: Diagnosis not present

## 2015-12-25 DIAGNOSIS — M24572 Contracture, left ankle: Secondary | ICD-10-CM | POA: Diagnosis not present

## 2015-12-25 DIAGNOSIS — R293 Abnormal posture: Secondary | ICD-10-CM | POA: Diagnosis not present

## 2015-12-25 DIAGNOSIS — R1312 Dysphagia, oropharyngeal phase: Secondary | ICD-10-CM | POA: Diagnosis not present

## 2015-12-25 DIAGNOSIS — R278 Other lack of coordination: Secondary | ICD-10-CM | POA: Diagnosis not present

## 2015-12-28 DIAGNOSIS — R1312 Dysphagia, oropharyngeal phase: Secondary | ICD-10-CM | POA: Diagnosis not present

## 2015-12-28 DIAGNOSIS — R278 Other lack of coordination: Secondary | ICD-10-CM | POA: Diagnosis not present

## 2015-12-28 DIAGNOSIS — M24572 Contracture, left ankle: Secondary | ICD-10-CM | POA: Diagnosis not present

## 2015-12-29 DIAGNOSIS — M24572 Contracture, left ankle: Secondary | ICD-10-CM | POA: Diagnosis not present

## 2015-12-29 DIAGNOSIS — R278 Other lack of coordination: Secondary | ICD-10-CM | POA: Diagnosis not present

## 2015-12-29 DIAGNOSIS — R1312 Dysphagia, oropharyngeal phase: Secondary | ICD-10-CM | POA: Diagnosis not present

## 2015-12-30 DIAGNOSIS — M24572 Contracture, left ankle: Secondary | ICD-10-CM | POA: Diagnosis not present

## 2015-12-30 DIAGNOSIS — R278 Other lack of coordination: Secondary | ICD-10-CM | POA: Diagnosis not present

## 2015-12-30 DIAGNOSIS — R1312 Dysphagia, oropharyngeal phase: Secondary | ICD-10-CM | POA: Diagnosis not present

## 2016-01-02 DIAGNOSIS — M24572 Contracture, left ankle: Secondary | ICD-10-CM | POA: Diagnosis not present

## 2016-01-02 DIAGNOSIS — R278 Other lack of coordination: Secondary | ICD-10-CM | POA: Diagnosis not present

## 2016-01-02 DIAGNOSIS — R1312 Dysphagia, oropharyngeal phase: Secondary | ICD-10-CM | POA: Diagnosis not present

## 2016-01-04 DIAGNOSIS — R278 Other lack of coordination: Secondary | ICD-10-CM | POA: Diagnosis not present

## 2016-01-04 DIAGNOSIS — M24572 Contracture, left ankle: Secondary | ICD-10-CM | POA: Diagnosis not present

## 2016-01-04 DIAGNOSIS — R1312 Dysphagia, oropharyngeal phase: Secondary | ICD-10-CM | POA: Diagnosis not present

## 2016-01-04 DIAGNOSIS — R4182 Altered mental status, unspecified: Secondary | ICD-10-CM | POA: Diagnosis not present

## 2016-01-05 DIAGNOSIS — R1312 Dysphagia, oropharyngeal phase: Secondary | ICD-10-CM | POA: Diagnosis not present

## 2016-01-05 DIAGNOSIS — M24572 Contracture, left ankle: Secondary | ICD-10-CM | POA: Diagnosis not present

## 2016-01-05 DIAGNOSIS — R278 Other lack of coordination: Secondary | ICD-10-CM | POA: Diagnosis not present

## 2016-01-06 DIAGNOSIS — R278 Other lack of coordination: Secondary | ICD-10-CM | POA: Diagnosis not present

## 2016-01-06 DIAGNOSIS — R1312 Dysphagia, oropharyngeal phase: Secondary | ICD-10-CM | POA: Diagnosis not present

## 2016-01-06 DIAGNOSIS — M24572 Contracture, left ankle: Secondary | ICD-10-CM | POA: Diagnosis not present

## 2016-01-11 DIAGNOSIS — R1312 Dysphagia, oropharyngeal phase: Secondary | ICD-10-CM | POA: Diagnosis not present

## 2016-01-11 DIAGNOSIS — M24572 Contracture, left ankle: Secondary | ICD-10-CM | POA: Diagnosis not present

## 2016-01-11 DIAGNOSIS — R278 Other lack of coordination: Secondary | ICD-10-CM | POA: Diagnosis not present

## 2016-01-12 DIAGNOSIS — R278 Other lack of coordination: Secondary | ICD-10-CM | POA: Diagnosis not present

## 2016-01-12 DIAGNOSIS — R1312 Dysphagia, oropharyngeal phase: Secondary | ICD-10-CM | POA: Diagnosis not present

## 2016-01-12 DIAGNOSIS — M24572 Contracture, left ankle: Secondary | ICD-10-CM | POA: Diagnosis not present

## 2016-01-14 DIAGNOSIS — R278 Other lack of coordination: Secondary | ICD-10-CM | POA: Diagnosis not present

## 2016-01-14 DIAGNOSIS — R1312 Dysphagia, oropharyngeal phase: Secondary | ICD-10-CM | POA: Diagnosis not present

## 2016-01-14 DIAGNOSIS — M24572 Contracture, left ankle: Secondary | ICD-10-CM | POA: Diagnosis not present

## 2016-01-18 DIAGNOSIS — R1312 Dysphagia, oropharyngeal phase: Secondary | ICD-10-CM | POA: Diagnosis not present

## 2016-01-18 DIAGNOSIS — R278 Other lack of coordination: Secondary | ICD-10-CM | POA: Diagnosis not present

## 2016-01-18 DIAGNOSIS — M24572 Contracture, left ankle: Secondary | ICD-10-CM | POA: Diagnosis not present

## 2016-01-26 DIAGNOSIS — K219 Gastro-esophageal reflux disease without esophagitis: Secondary | ICD-10-CM | POA: Diagnosis not present

## 2016-01-26 DIAGNOSIS — E559 Vitamin D deficiency, unspecified: Secondary | ICD-10-CM | POA: Diagnosis not present

## 2016-01-26 DIAGNOSIS — E785 Hyperlipidemia, unspecified: Secondary | ICD-10-CM | POA: Diagnosis not present

## 2016-01-26 DIAGNOSIS — F068 Other specified mental disorders due to known physiological condition: Secondary | ICD-10-CM | POA: Diagnosis not present

## 2016-02-04 DIAGNOSIS — R293 Abnormal posture: Secondary | ICD-10-CM | POA: Diagnosis not present

## 2016-02-04 DIAGNOSIS — M24571 Contracture, right ankle: Secondary | ICD-10-CM | POA: Diagnosis not present

## 2016-02-04 DIAGNOSIS — R1312 Dysphagia, oropharyngeal phase: Secondary | ICD-10-CM | POA: Diagnosis not present

## 2016-02-04 DIAGNOSIS — R278 Other lack of coordination: Secondary | ICD-10-CM | POA: Diagnosis not present

## 2016-02-04 DIAGNOSIS — M6281 Muscle weakness (generalized): Secondary | ICD-10-CM | POA: Diagnosis not present

## 2016-02-04 DIAGNOSIS — M24572 Contracture, left ankle: Secondary | ICD-10-CM | POA: Diagnosis not present

## 2016-02-08 DIAGNOSIS — R1312 Dysphagia, oropharyngeal phase: Secondary | ICD-10-CM | POA: Diagnosis not present

## 2016-02-08 DIAGNOSIS — R278 Other lack of coordination: Secondary | ICD-10-CM | POA: Diagnosis not present

## 2016-02-08 DIAGNOSIS — M6281 Muscle weakness (generalized): Secondary | ICD-10-CM | POA: Diagnosis not present

## 2016-02-08 DIAGNOSIS — R293 Abnormal posture: Secondary | ICD-10-CM | POA: Diagnosis not present

## 2016-02-08 DIAGNOSIS — M24572 Contracture, left ankle: Secondary | ICD-10-CM | POA: Diagnosis not present

## 2016-02-08 DIAGNOSIS — M24571 Contracture, right ankle: Secondary | ICD-10-CM | POA: Diagnosis not present

## 2016-02-11 DIAGNOSIS — R293 Abnormal posture: Secondary | ICD-10-CM | POA: Diagnosis not present

## 2016-02-11 DIAGNOSIS — M6281 Muscle weakness (generalized): Secondary | ICD-10-CM | POA: Diagnosis not present

## 2016-02-11 DIAGNOSIS — R278 Other lack of coordination: Secondary | ICD-10-CM | POA: Diagnosis not present

## 2016-02-11 DIAGNOSIS — M24571 Contracture, right ankle: Secondary | ICD-10-CM | POA: Diagnosis not present

## 2016-02-11 DIAGNOSIS — R1312 Dysphagia, oropharyngeal phase: Secondary | ICD-10-CM | POA: Diagnosis not present

## 2016-02-11 DIAGNOSIS — M24572 Contracture, left ankle: Secondary | ICD-10-CM | POA: Diagnosis not present

## 2016-02-12 DIAGNOSIS — M24571 Contracture, right ankle: Secondary | ICD-10-CM | POA: Diagnosis not present

## 2016-02-12 DIAGNOSIS — M24572 Contracture, left ankle: Secondary | ICD-10-CM | POA: Diagnosis not present

## 2016-02-12 DIAGNOSIS — R278 Other lack of coordination: Secondary | ICD-10-CM | POA: Diagnosis not present

## 2016-02-12 DIAGNOSIS — M6281 Muscle weakness (generalized): Secondary | ICD-10-CM | POA: Diagnosis not present

## 2016-02-12 DIAGNOSIS — R1312 Dysphagia, oropharyngeal phase: Secondary | ICD-10-CM | POA: Diagnosis not present

## 2016-02-12 DIAGNOSIS — R293 Abnormal posture: Secondary | ICD-10-CM | POA: Diagnosis not present

## 2016-02-15 DIAGNOSIS — R293 Abnormal posture: Secondary | ICD-10-CM | POA: Diagnosis not present

## 2016-02-15 DIAGNOSIS — R278 Other lack of coordination: Secondary | ICD-10-CM | POA: Diagnosis not present

## 2016-02-15 DIAGNOSIS — M6281 Muscle weakness (generalized): Secondary | ICD-10-CM | POA: Diagnosis not present

## 2016-02-15 DIAGNOSIS — M24571 Contracture, right ankle: Secondary | ICD-10-CM | POA: Diagnosis not present

## 2016-02-15 DIAGNOSIS — R1312 Dysphagia, oropharyngeal phase: Secondary | ICD-10-CM | POA: Diagnosis not present

## 2016-02-15 DIAGNOSIS — M24572 Contracture, left ankle: Secondary | ICD-10-CM | POA: Diagnosis not present

## 2016-02-17 DIAGNOSIS — R278 Other lack of coordination: Secondary | ICD-10-CM | POA: Diagnosis not present

## 2016-02-17 DIAGNOSIS — R1312 Dysphagia, oropharyngeal phase: Secondary | ICD-10-CM | POA: Diagnosis not present

## 2016-02-17 DIAGNOSIS — M24571 Contracture, right ankle: Secondary | ICD-10-CM | POA: Diagnosis not present

## 2016-02-17 DIAGNOSIS — R293 Abnormal posture: Secondary | ICD-10-CM | POA: Diagnosis not present

## 2016-02-17 DIAGNOSIS — M6281 Muscle weakness (generalized): Secondary | ICD-10-CM | POA: Diagnosis not present

## 2016-02-17 DIAGNOSIS — M24572 Contracture, left ankle: Secondary | ICD-10-CM | POA: Diagnosis not present

## 2016-02-18 DIAGNOSIS — R293 Abnormal posture: Secondary | ICD-10-CM | POA: Diagnosis not present

## 2016-02-18 DIAGNOSIS — R1312 Dysphagia, oropharyngeal phase: Secondary | ICD-10-CM | POA: Diagnosis not present

## 2016-02-18 DIAGNOSIS — M24571 Contracture, right ankle: Secondary | ICD-10-CM | POA: Diagnosis not present

## 2016-02-18 DIAGNOSIS — R278 Other lack of coordination: Secondary | ICD-10-CM | POA: Diagnosis not present

## 2016-02-18 DIAGNOSIS — M24572 Contracture, left ankle: Secondary | ICD-10-CM | POA: Diagnosis not present

## 2016-02-18 DIAGNOSIS — M6281 Muscle weakness (generalized): Secondary | ICD-10-CM | POA: Diagnosis not present

## 2016-02-22 DIAGNOSIS — M24572 Contracture, left ankle: Secondary | ICD-10-CM | POA: Diagnosis not present

## 2016-02-22 DIAGNOSIS — M6281 Muscle weakness (generalized): Secondary | ICD-10-CM | POA: Diagnosis not present

## 2016-02-22 DIAGNOSIS — R278 Other lack of coordination: Secondary | ICD-10-CM | POA: Diagnosis not present

## 2016-02-22 DIAGNOSIS — R293 Abnormal posture: Secondary | ICD-10-CM | POA: Diagnosis not present

## 2016-02-22 DIAGNOSIS — M24571 Contracture, right ankle: Secondary | ICD-10-CM | POA: Diagnosis not present

## 2016-02-22 DIAGNOSIS — R1312 Dysphagia, oropharyngeal phase: Secondary | ICD-10-CM | POA: Diagnosis not present

## 2016-02-24 DIAGNOSIS — R278 Other lack of coordination: Secondary | ICD-10-CM | POA: Diagnosis not present

## 2016-02-24 DIAGNOSIS — R293 Abnormal posture: Secondary | ICD-10-CM | POA: Diagnosis not present

## 2016-02-24 DIAGNOSIS — M24571 Contracture, right ankle: Secondary | ICD-10-CM | POA: Diagnosis not present

## 2016-02-24 DIAGNOSIS — R1312 Dysphagia, oropharyngeal phase: Secondary | ICD-10-CM | POA: Diagnosis not present

## 2016-02-24 DIAGNOSIS — M6281 Muscle weakness (generalized): Secondary | ICD-10-CM | POA: Diagnosis not present

## 2016-02-24 DIAGNOSIS — M24572 Contracture, left ankle: Secondary | ICD-10-CM | POA: Diagnosis not present

## 2016-02-25 DIAGNOSIS — M6281 Muscle weakness (generalized): Secondary | ICD-10-CM | POA: Diagnosis not present

## 2016-02-25 DIAGNOSIS — M24571 Contracture, right ankle: Secondary | ICD-10-CM | POA: Diagnosis not present

## 2016-02-25 DIAGNOSIS — R1312 Dysphagia, oropharyngeal phase: Secondary | ICD-10-CM | POA: Diagnosis not present

## 2016-02-25 DIAGNOSIS — R293 Abnormal posture: Secondary | ICD-10-CM | POA: Diagnosis not present

## 2016-02-25 DIAGNOSIS — R278 Other lack of coordination: Secondary | ICD-10-CM | POA: Diagnosis not present

## 2016-02-25 DIAGNOSIS — M24572 Contracture, left ankle: Secondary | ICD-10-CM | POA: Diagnosis not present

## 2016-02-26 DIAGNOSIS — M6281 Muscle weakness (generalized): Secondary | ICD-10-CM | POA: Diagnosis not present

## 2016-02-26 DIAGNOSIS — M24572 Contracture, left ankle: Secondary | ICD-10-CM | POA: Diagnosis not present

## 2016-02-26 DIAGNOSIS — M24571 Contracture, right ankle: Secondary | ICD-10-CM | POA: Diagnosis not present

## 2016-02-26 DIAGNOSIS — R278 Other lack of coordination: Secondary | ICD-10-CM | POA: Diagnosis not present

## 2016-02-26 DIAGNOSIS — R293 Abnormal posture: Secondary | ICD-10-CM | POA: Diagnosis not present

## 2016-02-26 DIAGNOSIS — R1312 Dysphagia, oropharyngeal phase: Secondary | ICD-10-CM | POA: Diagnosis not present

## 2016-03-07 DIAGNOSIS — L89310 Pressure ulcer of right buttock, unstageable: Secondary | ICD-10-CM | POA: Diagnosis not present

## 2016-03-10 DIAGNOSIS — L89310 Pressure ulcer of right buttock, unstageable: Secondary | ICD-10-CM | POA: Diagnosis not present

## 2016-03-17 DIAGNOSIS — L89310 Pressure ulcer of right buttock, unstageable: Secondary | ICD-10-CM | POA: Diagnosis not present

## 2016-03-21 ENCOUNTER — Ambulatory Visit: Payer: Medicare Other | Admitting: Nurse Practitioner

## 2016-03-24 DIAGNOSIS — L89310 Pressure ulcer of right buttock, unstageable: Secondary | ICD-10-CM | POA: Diagnosis not present

## 2016-04-14 DIAGNOSIS — L89320 Pressure ulcer of left buttock, unstageable: Secondary | ICD-10-CM | POA: Diagnosis not present

## 2016-04-21 DIAGNOSIS — F0390 Unspecified dementia without behavioral disturbance: Secondary | ICD-10-CM | POA: Diagnosis not present

## 2016-04-21 DIAGNOSIS — L89310 Pressure ulcer of right buttock, unstageable: Secondary | ICD-10-CM | POA: Diagnosis not present

## 2016-04-21 DIAGNOSIS — E559 Vitamin D deficiency, unspecified: Secondary | ICD-10-CM | POA: Diagnosis not present

## 2016-04-21 DIAGNOSIS — G4700 Insomnia, unspecified: Secondary | ICD-10-CM | POA: Diagnosis not present

## 2016-04-28 DIAGNOSIS — L89310 Pressure ulcer of right buttock, unstageable: Secondary | ICD-10-CM | POA: Diagnosis not present

## 2016-05-05 DIAGNOSIS — L89310 Pressure ulcer of right buttock, unstageable: Secondary | ICD-10-CM | POA: Diagnosis not present

## 2016-05-06 DIAGNOSIS — F0390 Unspecified dementia without behavioral disturbance: Secondary | ICD-10-CM | POA: Diagnosis not present

## 2016-05-06 DIAGNOSIS — L89210 Pressure ulcer of right hip, unstageable: Secondary | ICD-10-CM | POA: Diagnosis not present

## 2016-05-06 DIAGNOSIS — K219 Gastro-esophageal reflux disease without esophagitis: Secondary | ICD-10-CM | POA: Diagnosis not present

## 2016-05-06 DIAGNOSIS — G40909 Epilepsy, unspecified, not intractable, without status epilepticus: Secondary | ICD-10-CM | POA: Diagnosis not present

## 2016-05-12 DIAGNOSIS — L89313 Pressure ulcer of right buttock, stage 3: Secondary | ICD-10-CM | POA: Diagnosis not present

## 2016-05-19 DIAGNOSIS — L89313 Pressure ulcer of right buttock, stage 3: Secondary | ICD-10-CM | POA: Diagnosis not present

## 2016-05-26 DIAGNOSIS — L89313 Pressure ulcer of right buttock, stage 3: Secondary | ICD-10-CM | POA: Diagnosis not present

## 2016-06-02 DIAGNOSIS — L89313 Pressure ulcer of right buttock, stage 3: Secondary | ICD-10-CM | POA: Diagnosis not present

## 2016-06-30 DIAGNOSIS — L89313 Pressure ulcer of right buttock, stage 3: Secondary | ICD-10-CM | POA: Diagnosis not present

## 2016-07-07 DIAGNOSIS — R05 Cough: Secondary | ICD-10-CM | POA: Diagnosis not present

## 2016-07-07 DIAGNOSIS — R0989 Other specified symptoms and signs involving the circulatory and respiratory systems: Secondary | ICD-10-CM | POA: Diagnosis not present

## 2016-07-07 DIAGNOSIS — R509 Fever, unspecified: Secondary | ICD-10-CM | POA: Diagnosis not present

## 2016-07-27 DIAGNOSIS — K219 Gastro-esophageal reflux disease without esophagitis: Secondary | ICD-10-CM | POA: Diagnosis not present

## 2016-07-27 DIAGNOSIS — E559 Vitamin D deficiency, unspecified: Secondary | ICD-10-CM | POA: Diagnosis not present

## 2016-07-27 DIAGNOSIS — F068 Other specified mental disorders due to known physiological condition: Secondary | ICD-10-CM | POA: Diagnosis not present

## 2016-07-27 DIAGNOSIS — F0390 Unspecified dementia without behavioral disturbance: Secondary | ICD-10-CM | POA: Diagnosis not present

## 2016-09-09 DIAGNOSIS — G40909 Epilepsy, unspecified, not intractable, without status epilepticus: Secondary | ICD-10-CM | POA: Diagnosis not present

## 2016-09-09 DIAGNOSIS — F0390 Unspecified dementia without behavioral disturbance: Secondary | ICD-10-CM | POA: Diagnosis not present

## 2016-09-09 DIAGNOSIS — R627 Adult failure to thrive: Secondary | ICD-10-CM | POA: Diagnosis not present

## 2016-11-16 DIAGNOSIS — E559 Vitamin D deficiency, unspecified: Secondary | ICD-10-CM | POA: Diagnosis not present

## 2016-11-16 DIAGNOSIS — F068 Other specified mental disorders due to known physiological condition: Secondary | ICD-10-CM | POA: Diagnosis not present

## 2016-11-16 DIAGNOSIS — Z9181 History of falling: Secondary | ICD-10-CM | POA: Diagnosis not present

## 2016-11-16 DIAGNOSIS — R627 Adult failure to thrive: Secondary | ICD-10-CM | POA: Diagnosis not present

## 2016-12-08 DIAGNOSIS — M24571 Contracture, right ankle: Secondary | ICD-10-CM | POA: Diagnosis not present

## 2016-12-08 DIAGNOSIS — M6281 Muscle weakness (generalized): Secondary | ICD-10-CM | POA: Diagnosis not present

## 2016-12-08 DIAGNOSIS — R278 Other lack of coordination: Secondary | ICD-10-CM | POA: Diagnosis not present

## 2016-12-08 DIAGNOSIS — M24572 Contracture, left ankle: Secondary | ICD-10-CM | POA: Diagnosis not present

## 2016-12-08 DIAGNOSIS — R293 Abnormal posture: Secondary | ICD-10-CM | POA: Diagnosis not present

## 2016-12-21 DIAGNOSIS — M24571 Contracture, right ankle: Secondary | ICD-10-CM | POA: Diagnosis not present

## 2016-12-21 DIAGNOSIS — R293 Abnormal posture: Secondary | ICD-10-CM | POA: Diagnosis not present

## 2016-12-21 DIAGNOSIS — M6281 Muscle weakness (generalized): Secondary | ICD-10-CM | POA: Diagnosis not present

## 2016-12-21 DIAGNOSIS — M24572 Contracture, left ankle: Secondary | ICD-10-CM | POA: Diagnosis not present

## 2016-12-21 DIAGNOSIS — R278 Other lack of coordination: Secondary | ICD-10-CM | POA: Diagnosis not present

## 2016-12-29 DIAGNOSIS — R293 Abnormal posture: Secondary | ICD-10-CM | POA: Diagnosis not present

## 2016-12-29 DIAGNOSIS — M6281 Muscle weakness (generalized): Secondary | ICD-10-CM | POA: Diagnosis not present

## 2016-12-29 DIAGNOSIS — R278 Other lack of coordination: Secondary | ICD-10-CM | POA: Diagnosis not present

## 2017-01-02 DIAGNOSIS — R293 Abnormal posture: Secondary | ICD-10-CM | POA: Diagnosis not present

## 2017-01-02 DIAGNOSIS — M6281 Muscle weakness (generalized): Secondary | ICD-10-CM | POA: Diagnosis not present

## 2017-01-02 DIAGNOSIS — R278 Other lack of coordination: Secondary | ICD-10-CM | POA: Diagnosis not present

## 2017-01-03 DIAGNOSIS — M6281 Muscle weakness (generalized): Secondary | ICD-10-CM | POA: Diagnosis not present

## 2017-01-03 DIAGNOSIS — R278 Other lack of coordination: Secondary | ICD-10-CM | POA: Diagnosis not present

## 2017-01-03 DIAGNOSIS — R293 Abnormal posture: Secondary | ICD-10-CM | POA: Diagnosis not present

## 2017-01-06 DIAGNOSIS — F0390 Unspecified dementia without behavioral disturbance: Secondary | ICD-10-CM | POA: Diagnosis not present

## 2017-01-06 DIAGNOSIS — R627 Adult failure to thrive: Secondary | ICD-10-CM | POA: Diagnosis not present

## 2017-01-06 DIAGNOSIS — L89319 Pressure ulcer of right buttock, unspecified stage: Secondary | ICD-10-CM | POA: Diagnosis not present

## 2017-01-06 DIAGNOSIS — F79 Unspecified intellectual disabilities: Secondary | ICD-10-CM | POA: Diagnosis not present

## 2017-03-29 DEATH — deceased
